# Patient Record
Sex: Female | Born: 1965 | Race: White | Hispanic: No | Marital: Married | Smoking: Never smoker
Health system: Southern US, Community
[De-identification: ages and names within clinical notes are randomized; demographics above are authoritative.]

## PROBLEM LIST (undated history)

## (undated) DIAGNOSIS — I82409 Acute embolism and thrombosis of unspecified deep veins of unspecified lower extremity: Secondary | ICD-10-CM

## (undated) DIAGNOSIS — M25559 Pain in unspecified hip: Secondary | ICD-10-CM

## (undated) DIAGNOSIS — R51 Headache: Secondary | ICD-10-CM

## (undated) DIAGNOSIS — I2699 Other pulmonary embolism without acute cor pulmonale: Secondary | ICD-10-CM

## (undated) DIAGNOSIS — M26629 Arthralgia of temporomandibular joint, unspecified side: Secondary | ICD-10-CM

## (undated) DIAGNOSIS — D649 Anemia, unspecified: Secondary | ICD-10-CM

## (undated) DIAGNOSIS — M1990 Unspecified osteoarthritis, unspecified site: Secondary | ICD-10-CM

## (undated) DIAGNOSIS — F329 Major depressive disorder, single episode, unspecified: Secondary | ICD-10-CM

## (undated) DIAGNOSIS — F32A Depression, unspecified: Secondary | ICD-10-CM

## (undated) DIAGNOSIS — K219 Gastro-esophageal reflux disease without esophagitis: Secondary | ICD-10-CM

## (undated) HISTORY — PX: HYSTEROSCOPY W/ ENDOMETRIAL ABLATION: SUR665

## (undated) HISTORY — PX: JOINT REPLACEMENT: SHX530

## (undated) HISTORY — PX: GASTRIC BYPASS: SHX52

## (undated) HISTORY — PX: NASAL SINUS SURGERY: SHX719

---

## 1987-03-13 HISTORY — PX: FEMUR FRACTURE SURGERY: SHX633

## 2004-02-06 ENCOUNTER — Other Ambulatory Visit: Payer: Self-pay

## 2004-02-06 ENCOUNTER — Emergency Department: Payer: Self-pay | Admitting: Emergency Medicine

## 2004-02-07 ENCOUNTER — Ambulatory Visit: Payer: Self-pay | Admitting: Emergency Medicine

## 2004-07-04 ENCOUNTER — Ambulatory Visit: Payer: Self-pay | Admitting: Neurology

## 2004-08-21 ENCOUNTER — Emergency Department: Payer: Self-pay | Admitting: Emergency Medicine

## 2004-12-11 ENCOUNTER — Ambulatory Visit: Payer: Self-pay | Admitting: Urology

## 2005-02-05 ENCOUNTER — Emergency Department: Payer: Self-pay | Admitting: Emergency Medicine

## 2006-07-30 ENCOUNTER — Ambulatory Visit: Payer: Self-pay

## 2006-11-27 ENCOUNTER — Ambulatory Visit: Payer: Self-pay | Admitting: Obstetrics and Gynecology

## 2006-11-29 ENCOUNTER — Ambulatory Visit: Payer: Self-pay | Admitting: Obstetrics and Gynecology

## 2008-03-31 ENCOUNTER — Ambulatory Visit: Payer: Self-pay | Admitting: Internal Medicine

## 2010-12-12 ENCOUNTER — Observation Stay: Payer: Self-pay | Admitting: Internal Medicine

## 2010-12-12 DIAGNOSIS — R072 Precordial pain: Secondary | ICD-10-CM

## 2010-12-14 ENCOUNTER — Inpatient Hospital Stay: Payer: Self-pay | Admitting: Psychiatry

## 2011-03-13 DIAGNOSIS — I82409 Acute embolism and thrombosis of unspecified deep veins of unspecified lower extremity: Secondary | ICD-10-CM

## 2011-03-13 DIAGNOSIS — I2699 Other pulmonary embolism without acute cor pulmonale: Secondary | ICD-10-CM

## 2011-03-13 HISTORY — DX: Other pulmonary embolism without acute cor pulmonale: I26.99

## 2011-03-13 HISTORY — DX: Acute embolism and thrombosis of unspecified deep veins of unspecified lower extremity: I82.409

## 2011-06-26 ENCOUNTER — Ambulatory Visit: Payer: Self-pay | Admitting: Unknown Physician Specialty

## 2011-09-05 ENCOUNTER — Ambulatory Visit: Payer: Self-pay | Admitting: Psychiatry

## 2011-09-10 ENCOUNTER — Ambulatory Visit: Payer: Self-pay | Admitting: Psychiatry

## 2011-10-23 ENCOUNTER — Ambulatory Visit: Payer: Self-pay | Admitting: Internal Medicine

## 2011-12-14 ENCOUNTER — Inpatient Hospital Stay: Payer: Self-pay | Admitting: Internal Medicine

## 2011-12-14 ENCOUNTER — Ambulatory Visit: Payer: Self-pay

## 2011-12-14 LAB — COMPREHENSIVE METABOLIC PANEL
Alkaline Phosphatase: 88 U/L (ref 50–136)
Anion Gap: 9 (ref 7–16)
BUN: 14 mg/dL (ref 7–18)
Bilirubin,Total: 0.4 mg/dL (ref 0.2–1.0)
Chloride: 103 mmol/L (ref 98–107)
Co2: 28 mmol/L (ref 21–32)
Creatinine: 1.13 mg/dL (ref 0.60–1.30)
EGFR (Non-African Amer.): 59 — ABNORMAL LOW
Osmolality: 279 (ref 275–301)
Potassium: 4.1 mmol/L (ref 3.5–5.1)
Sodium: 140 mmol/L (ref 136–145)
Total Protein: 7.6 g/dL (ref 6.4–8.2)

## 2011-12-14 LAB — CBC WITH DIFFERENTIAL/PLATELET
Basophil %: 1.3 %
Eosinophil #: 0.3 10*3/uL (ref 0.0–0.7)
Eosinophil %: 2.6 %
HGB: 13.6 g/dL (ref 12.0–16.0)
Lymphocyte %: 27.8 %
MCH: 31.5 pg (ref 26.0–34.0)
Monocyte #: 0.6 x10 3/mm (ref 0.2–0.9)
Neutrophil %: 63 %
RBC: 4.31 10*6/uL (ref 3.80–5.20)

## 2011-12-14 LAB — APTT: Activated PTT: 29.1 secs (ref 23.6–35.9)

## 2011-12-14 LAB — PROTIME-INR
INR: 1
Prothrombin Time: 13.2 secs (ref 11.5–14.7)

## 2011-12-15 LAB — CBC WITH DIFFERENTIAL/PLATELET
Basophil #: 0.1 10*3/uL (ref 0.0–0.1)
Eosinophil #: 0.3 10*3/uL (ref 0.0–0.7)
Lymphocyte #: 3.1 10*3/uL (ref 1.0–3.6)
MCV: 96 fL (ref 80–100)
Monocyte #: 0.6 x10 3/mm (ref 0.2–0.9)
Neutrophil %: 59.6 %
Platelet: 302 10*3/uL (ref 150–440)
RBC: 3.91 10*6/uL (ref 3.80–5.20)
WBC: 10.2 10*3/uL (ref 3.6–11.0)

## 2011-12-15 LAB — BASIC METABOLIC PANEL
Anion Gap: 11 (ref 7–16)
BUN: 17 mg/dL (ref 7–18)
Calcium, Total: 8.2 mg/dL — ABNORMAL LOW (ref 8.5–10.1)
Chloride: 105 mmol/L (ref 98–107)
Co2: 24 mmol/L (ref 21–32)
EGFR (Non-African Amer.): 54 — ABNORMAL LOW
Glucose: 152 mg/dL — ABNORMAL HIGH (ref 65–99)
Osmolality: 284 (ref 275–301)
Potassium: 3.7 mmol/L (ref 3.5–5.1)
Sodium: 140 mmol/L (ref 136–145)

## 2011-12-16 LAB — CBC WITH DIFFERENTIAL/PLATELET
Basophil #: 0 10*3/uL (ref 0.0–0.1)
Basophil %: 0.3 %
Eosinophil #: 0.3 10*3/uL (ref 0.0–0.7)
Eosinophil %: 2.5 %
HCT: 37.3 % (ref 35.0–47.0)
HGB: 12.8 g/dL (ref 12.0–16.0)
Lymphocyte %: 23.7 %
MCH: 32.7 pg (ref 26.0–34.0)
MCHC: 34.3 g/dL (ref 32.0–36.0)
Monocyte #: 0.7 x10 3/mm (ref 0.2–0.9)
Monocyte %: 6 %
Neutrophil #: 7.8 10*3/uL — ABNORMAL HIGH (ref 1.4–6.5)
Neutrophil %: 67.5 %
RDW: 13.9 % (ref 11.5–14.5)

## 2011-12-16 LAB — MAGNESIUM: Magnesium: 2.4 mg/dL

## 2011-12-16 LAB — BASIC METABOLIC PANEL
Anion Gap: 9 (ref 7–16)
Calcium, Total: 8 mg/dL — ABNORMAL LOW (ref 8.5–10.1)
Chloride: 107 mmol/L (ref 98–107)
Co2: 26 mmol/L (ref 21–32)
Creatinine: 1.13 mg/dL (ref 0.60–1.30)
Glucose: 99 mg/dL (ref 65–99)
Osmolality: 285 (ref 275–301)
Potassium: 4.7 mmol/L (ref 3.5–5.1)

## 2012-09-22 ENCOUNTER — Ambulatory Visit: Payer: Self-pay

## 2012-12-10 ENCOUNTER — Other Ambulatory Visit: Payer: Self-pay | Admitting: General Practice

## 2012-12-10 DIAGNOSIS — R0683 Snoring: Secondary | ICD-10-CM

## 2012-12-10 DIAGNOSIS — E669 Obesity, unspecified: Secondary | ICD-10-CM

## 2012-12-12 ENCOUNTER — Ambulatory Visit: Payer: Self-pay | Admitting: Bariatrics

## 2012-12-12 LAB — COMPREHENSIVE METABOLIC PANEL
Anion Gap: 6 — ABNORMAL LOW (ref 7–16)
BUN: 23 mg/dL — ABNORMAL HIGH (ref 7–18)
Glucose: 115 mg/dL — ABNORMAL HIGH (ref 65–99)
Osmolality: 275 (ref 275–301)
SGOT(AST): 20 U/L (ref 15–37)
SGPT (ALT): 22 U/L (ref 12–78)
Sodium: 135 mmol/L — ABNORMAL LOW (ref 136–145)

## 2012-12-12 LAB — PROTIME-INR
INR: 1
Prothrombin Time: 13.2 secs (ref 11.5–14.7)

## 2012-12-12 LAB — CBC WITH DIFFERENTIAL/PLATELET
Basophil #: 0.1 10*3/uL (ref 0.0–0.1)
Basophil %: 0.7 %
Eosinophil #: 0.2 10*3/uL (ref 0.0–0.7)
HGB: 13.5 g/dL (ref 12.0–16.0)
MCHC: 34.6 g/dL (ref 32.0–36.0)
MCV: 93 fL (ref 80–100)
Monocyte %: 5.7 %
Neutrophil %: 62 %
RDW: 14.8 % — ABNORMAL HIGH (ref 11.5–14.5)

## 2012-12-12 LAB — AMYLASE: Amylase: 50 U/L (ref 25–115)

## 2012-12-12 LAB — LIPASE, BLOOD: Lipase: 143 U/L (ref 73–393)

## 2012-12-12 LAB — HEMOGLOBIN A1C: Hemoglobin A1C: 6.9 % — ABNORMAL HIGH (ref 4.2–6.3)

## 2012-12-19 LAB — IRON AND TIBC
Iron Bind.Cap.(Total): 357 ug/dL (ref 250–450)
Iron Saturation: 26 %

## 2012-12-31 ENCOUNTER — Ambulatory Visit
Admission: RE | Admit: 2012-12-31 | Discharge: 2012-12-31 | Disposition: A | Discharge status: Home / Self Care | Payer: BC Managed Care – PPO | Source: Ambulatory Visit | Attending: General Practice | Admitting: General Practice

## 2012-12-31 DIAGNOSIS — E669 Obesity, unspecified: Secondary | ICD-10-CM

## 2012-12-31 DIAGNOSIS — R0683 Snoring: Secondary | ICD-10-CM

## 2013-01-09 ENCOUNTER — Ambulatory Visit: Payer: Self-pay | Admitting: General Practice

## 2013-01-10 ENCOUNTER — Ambulatory Visit: Payer: Self-pay | Admitting: General Practice

## 2013-01-15 ENCOUNTER — Ambulatory Visit: Payer: Self-pay | Admitting: Oncology

## 2013-01-22 ENCOUNTER — Ambulatory Visit: Payer: Self-pay | Admitting: Oncology

## 2013-01-29 ENCOUNTER — Other Ambulatory Visit: Payer: Self-pay | Admitting: Bariatrics

## 2013-02-09 ENCOUNTER — Ambulatory Visit: Payer: Self-pay | Admitting: Oncology

## 2013-02-09 ENCOUNTER — Other Ambulatory Visit: Payer: Self-pay | Admitting: Bariatrics

## 2013-03-02 ENCOUNTER — Ambulatory Visit: Payer: Self-pay | Admitting: General Practice

## 2013-03-12 ENCOUNTER — Ambulatory Visit: Payer: Self-pay | Admitting: General Practice

## 2013-03-12 HISTORY — PX: TOTAL HIP ARTHROPLASTY: SHX124

## 2013-07-17 ENCOUNTER — Ambulatory Visit: Payer: Self-pay | Admitting: General Practice

## 2013-07-20 ENCOUNTER — Other Ambulatory Visit: Payer: Self-pay | Admitting: Obstetrics and Gynecology

## 2013-07-23 ENCOUNTER — Encounter (HOSPITAL_COMMUNITY): Payer: Self-pay | Admitting: Pharmacist

## 2013-07-27 DIAGNOSIS — M8700 Idiopathic aseptic necrosis of unspecified bone: Secondary | ICD-10-CM | POA: Insufficient documentation

## 2013-07-28 ENCOUNTER — Encounter (HOSPITAL_COMMUNITY): Payer: Self-pay | Admitting: *Deleted

## 2013-07-29 NOTE — H&P (Signed)
NAMEMILIANI, DEIKE           ACCOUNT NO.:  [id number]  MEDICAL RECORD NO.:  [id number]  LOCATION:  [facility]                         FACILITY:  [facility]  PHYSICIAN:  Lovenia Kim, M.D.DATE OF BIRTH:  03/03/1966  DATE OF ADMISSION:  07/20/2013 DATE OF DISCHARGE:                             HISTORY & PHYSICAL   __________  CHIEF COMPLAINT:  Menometrorrhagia.  HISTORY OF PRESENT ILLNESS:  A 48 year old white female G0, P0, who presents for diagnostic hysteroscopy and endometrial ablation.  PAST MEDICAL HISTORY:  She has past medical history remarkable for hypertension, migraine headaches, pulmonary embolism, currently on no anticoagulation.  FAMILY HISTORY:  Pulmonary fibrosis, breast cancer, hypertension, ovarian cancer.  ALLERGIES:  She has no known drug allergies.  MEDICATIONS:  Torsemide, multivitamin, Prozac, vitamin supplements transdermal patch, and lorazepam as needed  PAST SURGICAL HISTORY:  Gastric bypass, sinus surgery, and right leg surgery.  PHYSICAL EXAMINATION:  GENERAL:  She is a well-developed, well-nourished white female, in no acute distress. HEENT:  Normal. NECK:  Supple.  Full range of motion. LUNGS:  Clear. HEART:  Regular rate and rhythm. ABDOMEN:  Soft, nontender. PELVIC:  Anteverted uterus, NSSC, no adnexal masses. EXTREMITIES:  There were no cords. NEUROLOGIC:  Nonfocal. SKIN:  Intact.  IMPRESSION:  Refractory menometrorrhagia  for definitive therapy.  PLAN:  Proceed with diagnostic hysteroscopy, D and C, NovaSure endometrial ablation.  Risks of anesthesia, infection, bleeding, injury to surrounding organs, possible need for repair were discussed.  Delayed versus immediate complications include bowel and bladder injury noted  NovaSure and possible failed  treatment are discussed. The patient acknowledges and wishes to proceed.     Lovenia Kim, M.D.     RJT/MEDQ  D:  07/29/2013  T:  07/29/2013  Job:  [id number]

## 2013-07-30 ENCOUNTER — Encounter (HOSPITAL_COMMUNITY): Payer: BC Managed Care – PPO | Admitting: Anesthesiology

## 2013-07-30 ENCOUNTER — Encounter (HOSPITAL_COMMUNITY): Payer: Self-pay | Admitting: *Deleted

## 2013-07-30 ENCOUNTER — Ambulatory Visit (HOSPITAL_COMMUNITY)
Admission: RE | Admit: 2013-07-30 | Discharge: 2013-07-30 | Disposition: A | Discharge status: Home / Self Care | Payer: BC Managed Care – PPO | Source: Ambulatory Visit | Attending: Obstetrics and Gynecology | Admitting: Obstetrics and Gynecology

## 2013-07-30 ENCOUNTER — Ambulatory Visit (HOSPITAL_COMMUNITY): Payer: BC Managed Care – PPO | Admitting: Anesthesiology

## 2013-07-30 ENCOUNTER — Encounter (HOSPITAL_COMMUNITY)
Admission: RE | Disposition: A | Discharge status: Home / Self Care | Payer: Self-pay | Source: Ambulatory Visit | Attending: Obstetrics and Gynecology

## 2013-07-30 DIAGNOSIS — F329 Major depressive disorder, single episode, unspecified: Secondary | ICD-10-CM | POA: Insufficient documentation

## 2013-07-30 DIAGNOSIS — N920 Excessive and frequent menstruation with regular cycle: Secondary | ICD-10-CM | POA: Insufficient documentation

## 2013-07-30 DIAGNOSIS — I10 Essential (primary) hypertension: Secondary | ICD-10-CM | POA: Insufficient documentation

## 2013-07-30 DIAGNOSIS — F3289 Other specified depressive episodes: Secondary | ICD-10-CM | POA: Insufficient documentation

## 2013-07-30 DIAGNOSIS — N840 Polyp of corpus uteri: Secondary | ICD-10-CM | POA: Insufficient documentation

## 2013-07-30 DIAGNOSIS — Z86711 Personal history of pulmonary embolism: Secondary | ICD-10-CM | POA: Insufficient documentation

## 2013-07-30 DIAGNOSIS — Z9884 Bariatric surgery status: Secondary | ICD-10-CM | POA: Insufficient documentation

## 2013-07-30 HISTORY — DX: Depression, unspecified: F32.A

## 2013-07-30 HISTORY — DX: Major depressive disorder, single episode, unspecified: F32.9

## 2013-07-30 HISTORY — DX: Other pulmonary embolism without acute cor pulmonale: I26.99

## 2013-07-30 HISTORY — DX: Arthralgia of temporomandibular joint, unspecified side: M26.629

## 2013-07-30 HISTORY — PX: DILITATION & CURRETTAGE/HYSTROSCOPY WITH NOVASURE ABLATION: SHX5568

## 2013-07-30 HISTORY — DX: Headache: R51

## 2013-07-30 HISTORY — DX: Pain in unspecified hip: M25.559

## 2013-07-30 LAB — BASIC METABOLIC PANEL
BUN: 15 mg/dL (ref 6–23)
CHLORIDE: 100 meq/L (ref 96–112)
CO2: 29 mEq/L (ref 19–32)
Calcium: 8.9 mg/dL (ref 8.4–10.5)
Creatinine, Ser: 1.08 mg/dL (ref 0.50–1.10)
GFR calc Af Amer: 71 mL/min — ABNORMAL LOW (ref >90)
GFR calc non Af Amer: 61 mL/min — ABNORMAL LOW (ref >90)
GLUCOSE: 96 mg/dL (ref 70–99)
POTASSIUM: 3.6 meq/L — AB (ref 3.7–5.3)
SODIUM: 140 meq/L (ref 137–147)

## 2013-07-30 LAB — CBC
HEMATOCRIT: 37.2 % (ref 36.0–46.0)
HEMOGLOBIN: 12.2 g/dL (ref 12.0–15.0)
MCH: 30.3 pg (ref 26.0–34.0)
MCHC: 32.8 g/dL (ref 30.0–36.0)
MCV: 92.5 fL (ref 78.0–100.0)
Platelets: 422 10*3/uL — ABNORMAL HIGH (ref 150–400)
RBC: 4.02 MIL/uL (ref 3.87–5.11)
RDW: 14.7 % (ref 11.5–15.5)
WBC: 9 10*3/uL (ref 4.0–10.5)

## 2013-07-30 LAB — HCG, SERUM, QUALITATIVE: Preg, Serum: NEGATIVE

## 2013-07-30 SURGERY — DILATATION & CURETTAGE/HYSTEROSCOPY WITH NOVASURE ABLATION
Anesthesia: General | Site: Uterus

## 2013-07-30 MED ORDER — CEFAZOLIN SODIUM-DEXTROSE 2-3 GM-% IV SOLR
2.0000 g | INTRAVENOUS | Status: AC
  Administered 2013-07-30: 2 g via INTRAVENOUS

## 2013-07-30 MED ORDER — FENTANYL CITRATE 0.05 MG/ML IJ SOLN
INTRAMUSCULAR | Status: DC | PRN
  Administered 2013-07-30 (×3): 50 ug via INTRAVENOUS

## 2013-07-30 MED ORDER — BUPIVACAINE HCL (PF) 0.25 % IJ SOLN
INTRAMUSCULAR | Status: AC
  Filled 2013-07-30: qty 30

## 2013-07-30 MED ORDER — ONDANSETRON HCL 4 MG/2ML IJ SOLN
INTRAMUSCULAR | Status: DC | PRN
  Administered 2013-07-30: 4 mg via INTRAVENOUS

## 2013-07-30 MED ORDER — LIDOCAINE HCL (CARDIAC) 20 MG/ML IV SOLN
INTRAVENOUS | Status: DC | PRN
  Administered 2013-07-30: 80 mg via INTRAVENOUS

## 2013-07-30 MED ORDER — MEPERIDINE HCL 25 MG/ML IJ SOLN: 6.2500 mg | INTRAMUSCULAR | Status: DC | PRN

## 2013-07-30 MED ORDER — KETOROLAC TROMETHAMINE 30 MG/ML IJ SOLN
INTRAMUSCULAR | Status: DC | PRN
  Administered 2013-07-30: 30 mg via INTRAVENOUS

## 2013-07-30 MED ORDER — BUPIVACAINE HCL (PF) 0.25 % IJ SOLN
INTRAMUSCULAR | Status: DC | PRN
  Administered 2013-07-30: 20 mL

## 2013-07-30 MED ORDER — PROPOFOL INFUSION 10 MG/ML OPTIME
INTRAVENOUS | Status: DC | PRN
  Administered 2013-07-30: 180 mL via INTRAVENOUS

## 2013-07-30 MED ORDER — KETOROLAC TROMETHAMINE 30 MG/ML IJ SOLN: 15.0000 mg | Freq: Once | INTRAMUSCULAR | Status: DC | PRN

## 2013-07-30 MED ORDER — LACTATED RINGERS IV SOLN
INTRAVENOUS | Status: DC
  Administered 2013-07-30 (×3): via INTRAVENOUS

## 2013-07-30 MED ORDER — HYDROCODONE-ACETAMINOPHEN 5-325 MG PO TABS: 1.0000 | ORAL_TABLET | Freq: Four times a day (QID) | ORAL | Status: DC | PRN

## 2013-07-30 MED ORDER — TRAMADOL HCL 50 MG PO TABS: 50.0000 mg | ORAL_TABLET | Freq: Four times a day (QID) | ORAL | Status: DC | PRN

## 2013-07-30 MED ORDER — FENTANYL CITRATE 0.05 MG/ML IJ SOLN
INTRAMUSCULAR | Status: AC
  Filled 2013-07-30: qty 5

## 2013-07-30 MED ORDER — LIDOCAINE HCL (CARDIAC) 20 MG/ML IV SOLN
INTRAVENOUS | Status: AC
  Filled 2013-07-30: qty 5

## 2013-07-30 MED ORDER — FENTANYL CITRATE 0.05 MG/ML IJ SOLN
25.0000 ug | INTRAMUSCULAR | Status: DC | PRN
  Administered 2013-07-30 (×2): 50 ug via INTRAVENOUS

## 2013-07-30 MED ORDER — MIDAZOLAM HCL 2 MG/2ML IJ SOLN
INTRAMUSCULAR | Status: AC
  Filled 2013-07-30: qty 2

## 2013-07-30 MED ORDER — BUPIVACAINE-EPINEPHRINE (PF) 0.25% -1:200000 IJ SOLN
INTRAMUSCULAR | Status: AC
  Filled 2013-07-30: qty 30

## 2013-07-30 MED ORDER — DEXAMETHASONE SODIUM PHOSPHATE 10 MG/ML IJ SOLN
INTRAMUSCULAR | Status: AC
  Filled 2013-07-30: qty 1

## 2013-07-30 MED ORDER — VASOPRESSIN 20 UNIT/ML IJ SOLN
INTRAMUSCULAR | Status: AC
  Filled 2013-07-30: qty 1

## 2013-07-30 MED ORDER — ONDANSETRON HCL 4 MG/2ML IJ SOLN: 4.0000 mg | Freq: Once | INTRAMUSCULAR | Status: DC | PRN

## 2013-07-30 MED ORDER — DEXAMETHASONE SODIUM PHOSPHATE 4 MG/ML IJ SOLN
INTRAMUSCULAR | Status: DC | PRN
  Administered 2013-07-30: 10 mg via INTRAVENOUS

## 2013-07-30 MED ORDER — SODIUM CHLORIDE 0.9 % IJ SOLN
INTRAMUSCULAR | Status: AC
  Filled 2013-07-30: qty 50

## 2013-07-30 MED ORDER — PROPOFOL 10 MG/ML IV EMUL
INTRAVENOUS | Status: AC
  Filled 2013-07-30: qty 20

## 2013-07-30 MED ORDER — MIDAZOLAM HCL 2 MG/2ML IJ SOLN
INTRAMUSCULAR | Status: DC | PRN
  Administered 2013-07-30: 2 mg via INTRAVENOUS

## 2013-07-30 MED ORDER — LACTATED RINGERS IR SOLN
Status: DC | PRN
  Administered 2013-07-30: 3000 mL

## 2013-07-30 MED ORDER — ONDANSETRON HCL 4 MG/2ML IJ SOLN
INTRAMUSCULAR | Status: AC
Start: 2013-07-30 — End: 2013-07-30
  Filled 2013-07-30: qty 2

## 2013-07-30 MED ORDER — FENTANYL CITRATE 0.05 MG/ML IJ SOLN
INTRAMUSCULAR | Status: AC
  Administered 2013-07-30: 50 ug via INTRAVENOUS
  Filled 2013-07-30: qty 2

## 2013-07-30 SURGICAL SUPPLY — 19 items
ABLATOR ENDOMETRIAL BIPOLAR (ABLATOR) ×2 IMPLANT
CATH ROBINSON RED A/P 16FR (CATHETERS) ×2 IMPLANT
CATH THERMACHOICE III (CATHETERS) ×2 IMPLANT
CLOTH BEACON ORANGE TIMEOUT ST (SAFETY) ×2 IMPLANT
CONTAINER PREFILL 10% NBF 60ML (FORM) ×4 IMPLANT
DRAPE HYSTEROSCOPY (DRAPE) ×2 IMPLANT
DRSG TELFA 3X8 NADH (GAUZE/BANDAGES/DRESSINGS) ×2 IMPLANT
GLOVE BIO SURGEON STRL SZ7.5 (GLOVE) ×2 IMPLANT
GOWN STRL REUS W/TWL LRG LVL3 (GOWN DISPOSABLE) ×4 IMPLANT
NEEDLE SPNL 22GX3.5 QUINCKE BK (NEEDLE) ×2 IMPLANT
PACK VAGINAL MINOR WOMEN LF (CUSTOM PROCEDURE TRAY) ×2 IMPLANT
PAD OB MATERNITY 4.3X12.25 (PERSONAL CARE ITEMS) ×2 IMPLANT
PAD PREP 24X48 CUFFED NSTRL (MISCELLANEOUS) ×2 IMPLANT
SET TUBING HYSTEROSCOPY 2 NDL (TUBING) IMPLANT
SYR CONTROL 10ML LL (SYRINGE) ×2 IMPLANT
SYR TB 1ML 25GX5/8 (SYRINGE) ×2 IMPLANT
TOWEL OR 17X24 6PK STRL BLUE (TOWEL DISPOSABLE) ×4 IMPLANT
TUBE HYSTEROSCOPY W Y-CONNECT (TUBING) IMPLANT
WATER STERILE IRR 1000ML POUR (IV SOLUTION) ×2 IMPLANT

## 2013-07-30 NOTE — Anesthesia Postprocedure Evaluation (Signed)
  Anesthesia Post-op Note  Anesthesia Post Note  Patient: Brittney Diaz  Procedure(s) Performed: Procedure(s) (LRB): DILATATION & CURETTAGE/HYSTEROSCOPY WITH  THERMACHOICE, AND POLYPECTOMY (N/A)  Anesthesia type: General  Patient location: PACU  Post pain: Pain level controlled  Post assessment: Post-op Vital signs reviewed  Last Vitals:  Filed Vitals:   07/30/13 1100  BP: 116/68  Pulse: 51  Temp: 36.6 C  Resp: 16    Post vital signs: Reviewed  Level of consciousness: sedated  Complications: No apparent anesthesia complications

## 2013-07-30 NOTE — Anesthesia Preprocedure Evaluation (Signed)
Anesthesia Evaluation  Patient identified by MRN, date of birth, ID band Patient awake    Reviewed: Allergy & Precautions, H&P , NPO status , Patient's Chart, lab work & pertinent test results, reviewed documented beta blocker date and time   History of Anesthesia Complications Negative for: history of anesthetic complications  Airway Mallampati: I TM Distance: >3 FB Neck ROM: full    Dental  (+) Teeth Intact   Pulmonary PE (2013 - was on xarelto, stopped after 6 months) breath sounds clear to auscultation  Pulmonary exam normal       Cardiovascular negative cardio ROS  - dysrhythmias Rhythm:regular Rate:Normal     Neuro/Psych  Headaches (migraine - once every few weeks), Depression    GI/Hepatic negative GI ROS, Neg liver ROS,   Endo/Other  Obese - BMI 33.4  Renal/GU negative Renal ROS  Female GU complaint     Musculoskeletal   Abdominal   Peds  Hematology negative hematology ROS (+)   Anesthesia Other Findings Left hip pain, possibly due to AVN - CAREFUL POSITIONING  Reproductive/Obstetrics negative OB ROS                           Anesthesia Physical Anesthesia Plan  ASA: II  Anesthesia Plan: General LMA   Post-op Pain Management:    Induction:   Airway Management Planned:   Additional Equipment:   Intra-op Plan:   Post-operative Plan:   Informed Consent: I have reviewed the patients History and Physical, chart, labs and discussed the procedure including the risks, benefits and alternatives for the proposed anesthesia with the patient or authorized representative who has indicated his/her understanding and acceptance.   Dental Advisory Given  Plan Discussed with: CRNA and Surgeon  Anesthesia Plan Comments:         Anesthesia Quick Evaluation

## 2013-07-30 NOTE — Discharge Instructions (Signed)
DISCHARGE INSTRUCTIONS: D&C / D&E The following instructions have been prepared to help you care for yourself upon your return home.  MAY TAKE IBUPROFEN (MOTRIN, ADVIL) OR ALEVE AFTER 4:00 PM FOR CRAMPS!!!   Personal hygiene:  Use sanitary pads for vaginal drainage, not tampons.  Shower the day after your procedure.  NO tub baths, pools or Jacuzzis for 2-3 weeks.  Wipe front to back after using the bathroom.  Activity and limitations:  Do NOT drive or operate any equipment for 24 hours. The effects of anesthesia are still present and drowsiness may result.  Do NOT rest in bed all day.  Walking is encouraged.  Walk up and down stairs slowly.  You may resume your normal activity in one to two days or as indicated by your physician.  Sexual activity: NO intercourse for at least 2 weeks after the procedure, or as indicated by your physician.  Diet: Eat a light meal as desired this evening. You may resume your usual diet tomorrow.  Return to work: You may resume your work activities in one to two days or as indicated by your doctor.  What to expect after your surgery: Expect to have vaginal bleeding/discharge for 2-3 days and spotting for up to 10 days. It is not unusual to have soreness for up to 1-2 weeks. You may have a slight burning sensation when you urinate for the first day. Mild cramps may continue for a couple of days. You may have a regular period in 2-6 weeks.  Call your doctor for any of the following:  Excessive vaginal bleeding, saturating and changing one pad every hour.  Inability to urinate 6 hours after discharge from hospital.  Pain not relieved by pain medication.  Fever of 100.4 F or greater.  Unusual vaginal discharge or odor.   Call for an appointment:    Patients signature: ______________________  Nurses signature ________________________  Support person's signature_______________________

## 2013-07-30 NOTE — Progress Notes (Signed)
Patient ID: Brittney Diaz, female   DOB: 07-Jul-1965, 48 y.o.   MRN: [id number] Patient seen and examined. Consent witnessed and signed. No changes noted. Update completed. CBC    Component Value Date/Time   WBC 9.0 07/30/2013 0725   RBC 4.02 07/30/2013 0725   HGB 12.2 07/30/2013 0725   HCT 37.2 07/30/2013 0725   PLT 422* 07/30/2013 0725   MCV 92.5 07/30/2013 0725   MCH 30.3 07/30/2013 0725   MCHC 32.8 07/30/2013 0725   RDW 14.7 07/30/2013 0725

## 2013-07-30 NOTE — Op Note (Signed)
Brittney Diaz, Brittney Diaz           ACCOUNT NO.:  [id number]  MEDICAL RECORD NO.:  [id number]  LOCATION:  [facility]                          FACILITY:  [facility]  PHYSICIAN:  Lovenia Kim, M.D.DATE OF BIRTH:  12-Dec-1965  DATE OF PROCEDURE: DATE OF DISCHARGE:  07/30/2013                              OPERATIVE REPORT   PREOPERATIVE DIAGNOSIS:  Menometrorrhagia.  POSTOPERATIVE DIAGNOSES:  Menometrorrhagia plus endometrial polyp.  PROCEDURES:  Diagnostic hysteroscopy, D and C, removal of endometrial polyp, ThermaChoice endometrial ablation, attempted NovaSure endometrial ablation.  SURGEON:  Lovenia Kim, M.D.  ASSISTANT:  None.  ANESTHESIA:  General and local.  ESTIMATED BLOOD LOSS:  Less than 50 mL.  FLUID DEFICIT:  150 mL.  DRAINS:  None.  COUNTS:  Correct.  DISPOSITION:  The patient to recovery in good condition.  BRIEF OPERATIVE NOTE:  After being apprised of risks of anesthesia, infection, bleeding, injury to surrounding organs, possible need for repair, delayed versus immediate complications to include bowel and bladder injury, possible need for repair, the patient was brought to the operating room where she was administered a general anesthetic without complications, prepped in the usual sterile fashion, catheterized until the bladder was empty.  Exam under anesthesia revealed a small, but bulky anteflexed uterus and some apical vaginal stenosis.  At this time, the Sims retractor was placed without difficulty.  Cervix was grasped using a single-tooth tenaculum and the dilute Marcaine solution was placed 20 mL total with paracervical block.  Cervix was then easily dilated up to a #23 Pratt dilator.  Hysteroscope placed.  Visualization revealed a focal endometrial polyp at the left fundal area.  An operative forceps was then placed through the operative channel of the diagnostic hysteroscope to remove this polyp without difficulty.  D and C was then performed in a  4-quadrant method.  NovaSure device was then placed.  It was unable to be seated due to the thin endometrial cavity. Therefore, the ThermaChoice device was placed and primed in the usual fashion and insufflated for a procedure lasting 8 minutes with normal cool down.  Device was removed and found to be intact.  Visualization of the cavity revealed evidence of good endometrial ablation.  All instruments were then removed.  The patient tolerated the procedure well.  Please note that on the repeat hysteroscopy, no evidence of perforation was noted.  The patient was awakened and transferred to recovery in good condition.     Lovenia Kim, M.D.     RJT/MEDQ  D:  07/30/2013  T:  07/30/2013  Job:  [id number]

## 2013-07-30 NOTE — Op Note (Signed)
07/30/2013  9:57 AM  PATIENT:  Brittney Diaz  48 y.o. female  PRE-OPERATIVE DIAGNOSIS:  Menorrhagia  POST-OPERATIVE DIAGNOSIS:  Same  PROCEDURE:  Procedure(s): DILATATION & CURETTAGE/HYSTEROSCOPY WITH  ABLATION- THERMACHOICE ENDOMETRIAL  POLYPECTOMY  SURGEON:  Surgeon(s): Lovenia Kim, MD  ASSISTANTS: none   ANESTHESIA:   local and general  ESTIMATED BLOOD LOSS: minimal  DRAINS: none   LOCAL MEDICATIONS USED:  MARCAINE    and Amount: 20 ml  SPECIMEN:  Source of Specimen:  EMC and polyp  DISPOSITION OF SPECIMEN:  PATHOLOGY  COUNTS:  YES  DICTATION #: [id number]  PLAN OF CARE: dc home  PATIENT DISPOSITION:  PACU - hemodynamically stable.

## 2013-07-30 NOTE — Transfer of Care (Signed)
Immediate Anesthesia Transfer of Care Note  Patient: Brittney Diaz  Procedure(s) Performed: Procedure(s): DILATATION & CURETTAGE/HYSTEROSCOPY WITH  THERMACHOICE, AND POLYPECTOMY (N/A)  Patient Location: PACU  Anesthesia Type:General  Level of Consciousness: awake, alert  and oriented  Airway & Oxygen Therapy: Patient Spontanous Breathing and Patient connected to nasal cannula oxygen  Post-op Assessment: Report given to PACU RN, Post -op Vital signs reviewed and stable and Patient moving all extremities  Post vital signs: Reviewed and stable  Complications: No apparent anesthesia complications

## 2013-07-31 ENCOUNTER — Encounter (HOSPITAL_COMMUNITY): Payer: Self-pay | Admitting: Obstetrics and Gynecology

## 2013-09-14 ENCOUNTER — Ambulatory Visit: Payer: Self-pay | Admitting: Gastroenterology

## 2013-11-10 DIAGNOSIS — G2581 Restless legs syndrome: Secondary | ICD-10-CM | POA: Insufficient documentation

## 2013-11-10 DIAGNOSIS — Z86711 Personal history of pulmonary embolism: Secondary | ICD-10-CM | POA: Insufficient documentation

## 2013-11-10 DIAGNOSIS — K219 Gastro-esophageal reflux disease without esophagitis: Secondary | ICD-10-CM | POA: Insufficient documentation

## 2013-11-17 ENCOUNTER — Ambulatory Visit: Payer: Self-pay | Admitting: Family Medicine

## 2013-11-20 ENCOUNTER — Ambulatory Visit: Payer: Self-pay | Admitting: Oncology

## 2013-11-27 ENCOUNTER — Ambulatory Visit: Payer: Self-pay | Admitting: Gastroenterology

## 2013-12-10 ENCOUNTER — Ambulatory Visit: Payer: Self-pay | Admitting: Oncology

## 2014-01-12 DIAGNOSIS — M25559 Pain in unspecified hip: Secondary | ICD-10-CM | POA: Insufficient documentation

## 2014-02-12 ENCOUNTER — Ambulatory Visit: Payer: Self-pay | Admitting: Bariatrics

## 2014-02-12 LAB — CBC WITH DIFFERENTIAL/PLATELET
BASOS ABS: 0.1 10*3/uL (ref 0.0–0.1)
Basophil %: 0.6 %
EOS ABS: 0.3 10*3/uL (ref 0.0–0.7)
Eosinophil %: 2.9 %
HCT: 37.3 % (ref 35.0–47.0)
HGB: 12 g/dL (ref 12.0–16.0)
LYMPHS ABS: 3.2 10*3/uL (ref 1.0–3.6)
Lymphocyte %: 30.6 %
MCH: 28.9 pg (ref 26.0–34.0)
MCHC: 32 g/dL (ref 32.0–36.0)
MCV: 90 fL (ref 80–100)
MONOS PCT: 4.8 %
Monocyte #: 0.5 x10 3/mm (ref 0.2–0.9)
NEUTROS PCT: 61.1 %
Neutrophil #: 6.4 10*3/uL (ref 1.4–6.5)
Platelet: 492 10*3/uL — ABNORMAL HIGH (ref 150–440)
RBC: 4.15 10*6/uL (ref 3.80–5.20)
RDW: 14.4 % (ref 11.5–14.5)
WBC: 10.5 10*3/uL (ref 3.6–11.0)

## 2014-02-12 LAB — COMPREHENSIVE METABOLIC PANEL
ALT: 22 U/L
Albumin: 3.5 g/dL (ref 3.4–5.0)
Alkaline Phosphatase: 126 U/L — ABNORMAL HIGH
Anion Gap: 8 (ref 7–16)
BUN: 16 mg/dL (ref 7–18)
Bilirubin,Total: 0.2 mg/dL (ref 0.2–1.0)
CREATININE: 1.15 mg/dL (ref 0.60–1.30)
Calcium, Total: 8.2 mg/dL — ABNORMAL LOW (ref 8.5–10.1)
Chloride: 98 mmol/L (ref 98–107)
Co2: 31 mmol/L (ref 21–32)
EGFR (African American): >60
GFR CALC NON AF AMER: 54 — AB
GLUCOSE: 121 mg/dL — AB (ref 65–99)
Osmolality: 276 (ref 275–301)
Potassium: 3 mmol/L — ABNORMAL LOW (ref 3.5–5.1)
SGOT(AST): 26 U/L (ref 15–37)
Sodium: 137 mmol/L (ref 136–145)
Total Protein: 7.7 g/dL (ref 6.4–8.2)

## 2014-02-12 LAB — FOLATE: FOLIC ACID: 11.6 ng/mL (ref 3.1–17.5)

## 2014-02-12 LAB — IRON: Iron: 34 ug/dL — ABNORMAL LOW (ref 50–170)

## 2014-02-12 LAB — HCG, QUANTITATIVE, PREGNANCY: Beta Hcg, Quant.: <1 m[IU]/mL — ABNORMAL LOW

## 2014-02-12 LAB — MAGNESIUM: Magnesium: 2 mg/dL

## 2014-02-12 LAB — PHOSPHORUS: Phosphorus: 3.1 mg/dL (ref 2.5–4.9)

## 2014-02-12 LAB — FERRITIN: Ferritin (ARMC): 16 ng/mL (ref 8–388)

## 2014-02-12 LAB — AMYLASE: AMYLASE: 68 U/L (ref 25–115)

## 2014-02-16 ENCOUNTER — Ambulatory Visit: Payer: Self-pay | Admitting: Bariatrics

## 2014-02-26 ENCOUNTER — Ambulatory Visit: Payer: Self-pay | Admitting: Urgent Care

## 2014-02-26 LAB — CBC WITH DIFFERENTIAL/PLATELET
BASOS PCT: 0.7 %
Basophil #: 0.1 10*3/uL (ref 0.0–0.1)
EOS ABS: 0.3 10*3/uL (ref 0.0–0.7)
Eosinophil %: 3.4 %
HCT: 35.3 % (ref 35.0–47.0)
HGB: 11.1 g/dL — ABNORMAL LOW (ref 12.0–16.0)
Lymphocyte #: 3 10*3/uL (ref 1.0–3.6)
Lymphocyte %: 38.5 %
MCH: 28.2 pg (ref 26.0–34.0)
MCHC: 31.5 g/dL — ABNORMAL LOW (ref 32.0–36.0)
MCV: 90 fL (ref 80–100)
MONO ABS: 0.5 x10 3/mm (ref 0.2–0.9)
Monocyte %: 6.4 %
NEUTROS ABS: 3.9 10*3/uL (ref 1.4–6.5)
Neutrophil %: 51 %
PLATELETS: 443 10*3/uL — AB (ref 150–440)
RBC: 3.94 10*6/uL (ref 3.80–5.20)
RDW: 14.8 % — ABNORMAL HIGH (ref 11.5–14.5)
WBC: 7.7 10*3/uL (ref 3.6–11.0)

## 2014-02-26 LAB — COMPREHENSIVE METABOLIC PANEL
ALBUMIN: 3.1 g/dL — AB (ref 3.4–5.0)
ALK PHOS: 117 U/L — AB
ALT: 16 U/L
Anion Gap: 5 — ABNORMAL LOW (ref 7–16)
BILIRUBIN TOTAL: 0.3 mg/dL (ref 0.2–1.0)
BUN: 13 mg/dL (ref 7–18)
CO2: 31 mmol/L (ref 21–32)
Calcium, Total: 8.5 mg/dL (ref 8.5–10.1)
Chloride: 105 mmol/L (ref 98–107)
Creatinine: 0.99 mg/dL (ref 0.60–1.30)
EGFR (African American): >60
EGFR (Non-African Amer.): >60
Glucose: 82 mg/dL (ref 65–99)
Osmolality: 280 (ref 275–301)
POTASSIUM: 4.2 mmol/L (ref 3.5–5.1)
SGOT(AST): 21 U/L (ref 15–37)
SODIUM: 141 mmol/L (ref 136–145)
TOTAL PROTEIN: 6.9 g/dL (ref 6.4–8.2)

## 2014-02-26 LAB — LIPASE, BLOOD: Lipase: 85 U/L (ref 73–393)

## 2014-03-23 DIAGNOSIS — Z96649 Presence of unspecified artificial hip joint: Secondary | ICD-10-CM | POA: Insufficient documentation

## 2014-03-30 ENCOUNTER — Ambulatory Visit: Payer: Self-pay

## 2014-03-30 DIAGNOSIS — Z9889 Other specified postprocedural states: Secondary | ICD-10-CM | POA: Insufficient documentation

## 2014-03-30 DIAGNOSIS — M7989 Other specified soft tissue disorders: Secondary | ICD-10-CM | POA: Insufficient documentation

## 2014-04-20 DIAGNOSIS — R269 Unspecified abnormalities of gait and mobility: Secondary | ICD-10-CM | POA: Insufficient documentation

## 2014-04-20 DIAGNOSIS — M2170 Unequal limb length (acquired), unspecified site: Secondary | ICD-10-CM | POA: Insufficient documentation

## 2014-06-29 NOTE — H&P (Signed)
PATIENT NAME:  Brittney Diaz, Brittney Diaz MR#:  [id number] DATE OF BIRTH:  04/29/1965  DATE OF ADMISSION:  12/14/2011  PRIMARY CARE PHYSICIAN: Tama High, III, MD   CHIEF COMPLAINT: Left lower extremity redness and swelling.   HISTORY OF PRESENT ILLNESS: The patient is a 49 year old female who presents to the Emergency Room as she was noted to have acute pulmonary embolism on a CT of the chest done today. The patient has been having symptoms of lower extremity swelling, paresthesias and pain for about two weeks or so. She was seen by Podiatry and then referred to Neurology, had an EMG study, was getting worked up for polyneuropathy. She had also been started on some vitamin B supplements recently. Although her leg had been red and swollen for the past 10 days, she was referred back to her primary care doctor. She was noted to have a significantly elevated D-dimer and underwent a CT of her chest today which showed an acute right-sided pulmonary embolism. She was, therefore, referred to the ER for further treatment and evaluation. The patient has been complaining of some exertional shortness of breath in the past 10 days but no hemoptysis, nor pleuritic chest pain, no nausea, no vomiting, no abdominal pain. No fevers, chills, cough. She does say that her left lower extremity has been a bit more swollen and red than usual.   REVIEW OF SYSTEMS: CONSTITUTIONAL: No documented fever. No weight gain or weight loss. EYES: No blurred or double vision. ENT: No tinnitus. No postnasal drip. No redness of the oropharynx. RESPIRATORY: No cough, no wheeze, no hemoptysis. Positive dyspnea. CARDIOVASCULAR: No chest pain, no orthopnea, no palpitations, no syncope. GASTROINTESTINAL: No nausea, no vomiting, no diarrhea, no abdominal pain, no melena or hematochezia. GU: No dysuria or hematuria. ENDOCRINE: No polyuria or nocturia. No heat or cold intolerance. HEMATOLOGIC: No anemia, no bruising, no bleeding. INTEGUMENTARY: No rashes.  No lesions. MUSCULOSKELETAL: No arthritis, no swelling, no gout. NEUROLOGIC: No numbness, no tingling, ataxia, no seizure-type activity. PSYCHIATRIC: No anxiety, no insomnia, no ADD.   PAST MEDICAL HISTORY:  1. Depression.  2. Hypertension.  3. Insomnia.  4. Restless leg syndrome.   ALLERGIES: No known drug allergies.   SOCIAL HISTORY: No smoking. No alcohol abuse. No illicit drug abuse. She lives at home with her husband.   FAMILY HISTORY: Mother is alive and healthy at age 19. Father died from complications of pulmonary fibrosis at age 53.   CURRENT MEDICATIONS:  1. Lexapro 20 mg daily.  2. HCTZ 25 mg daily.  3. Ibuprofen 800 mg t.i.d. as needed for headache.  4. NuvaRing vaginally monthly.  5. Requip 10 mg at bedtime.  6. Torsemide 20 mg daily.   PHYSICAL EXAMINATION:  VITAL SIGNS: On admission, temperature is 96, respirations 14, blood pressure 130/80, saturations 97% on room air.   GENERAL: She is a pleasant-appearing female in no apparent distress.   HEENT: Atraumatic, normocephalic. Extraocular muscles are intact. Pupils are equal, round reactive to light. Sclerae are anicteric. No conjunctival injection. No oropharyngeal erythema.   NECK: Supple. No jugular venous distention, no bruits, no lymphadenopathy, no thyromegaly.   HEART: Regular rate and rhythm. No murmurs, rubs, or clicks.   LUNGS: Clear to auscultation bilaterally. No rales, no rhonchi, no wheezes. She has poor inspiratory and expiratory effort.   ABDOMEN: Soft, flat, nondistended. Good bowel sounds. No hepatosplenomegaly appreciated.   EXTREMITIES: Her left lower extremity is significantly swollen and red. It is tender to touch. She has +2 pedal  and radial pulses bilaterally. No cyanosis. No clubbing.   SKIN: Moist and warm with no rashes.   LYMPHATIC: There is no cervical or axillary lymphadenopathy.   NEUROLOGIC: She is alert, awake, and oriented x3 with no focal motor or sensory deficits appreciated  bilaterally.   LABORATORY, DIAGNOSTIC AND RADIOLOGICAL DATA: White cell count 11, hemoglobin 13.6, hematocrit 40.8, platelet count 55, and INR is 1.0. Her metabolic profile is still currently pending. She did have a CT chest done with contrast today which showed extensive right-sided pulmonary emboli.   ASSESSMENT AND PLAN: This is a 49 year old female with a history of hypertension, depression, restless leg syndrome, insomnia, who presents to the hospital as she had been having significant left lower extremity redness, swelling and paresthesias for about 2 to 3 weeks, and now noted to have some shortness of breath for the past 10 days. She had a CT scan done as an outpatient which showed a positive right-sided pulmonary emboli.   1. Acute pulmonary embolus: Her likely risk factor is her being on birth control pills as she has a NuvaRing and her sedentary lifestyle. I think she still has some left lower extremity clot burden given her physical exam. Therefore, I will go ahead and get Dopplers of her lower extremities. She has received one dose of Lovenox here in the ER, and I will continue Lovenox for now. I did give her the option between Lovenox and Coumadin and being on Xarelto, and she is going to discuss it with her husband and find out insurance privileges prior to starting that. If she does decide to go with the Xarelto, then she may be able to be discharged tomorrow. If she still has significant clot burden in the left lower extremity, I will get a Vascular Surgery consult for possible placement of an inferior vena cava filter.  2. Hypertension: She is presently hemodynamically stable. I will continue hydrochlorothiazide and her torsemide.  3. Depression: I will continue her Lexapro.  4. Restless leg syndrome: Continue with Requip.   The patient will be transferred over to Dr. Ramond Craver service.        TIME SPENT ON ADMISSION: 50 minutes.  ____________________________ Belia Heman. Verdell Carmine,  MD vjs:cbb D: 12/14/2011 17:35:21 ET T: 12/14/2011 17:46:30 ET JOB#: [id number]  cc: Belia Heman. Verdell Carmine, MD, <Dictator> Adin Hector, MD  Henreitta Leber MD ELECTRONICALLY SIGNED 12/22/2011 14:57

## 2014-06-29 NOTE — Discharge Summary (Signed)
PATIENT NAME:  Brittney Diaz, IMHOFF MR#:  [id number] DATE OF BIRTH:  1965/05/25  DATE OF ADMISSION:  12/14/2011 DATE OF DISCHARGE:  12/16/2011  DISCHARGE DIAGNOSES:  1. Acute right pulmonary embolus.  2. Left lower extremity cellulitis with venous stasis dermatitis.  3. Restless leg syndrome.  4. Anxiety.   DISCHARGE MEDICATIONS:  1. NuvaRing monthly.  2. Mirapex 5 mg 2 tabs at bedtime.  3. Lexapro 20 mg daily.  4. Torsemide 20 mg daily.  5. Xarelto 15 mg b.i.d.   REASON FOR ADMISSION: 49 year old female presents with dyspnea and right pulmonary embolus with lower extremity edema. Please see history and physical for history of present illness, past medical history, physical exam.   HOSPITAL COURSE: Patient was admitted, initially placed on Lovenox and then per patient request was switched over to Xarelto. Her pulse oximetry was 96% on room air. She noticed improvement in her dyspnea. She had significant lower extremity edema which was treated with IV Lasix and electrolyte replacement. She will continue on the torsemide but will use knee-high compression socks as being a more effective way of controlling her lower extremity edema. She does have an echo pending to look at her right heart function next week. It was thought that her pulmonary emboli is most likely from her NuvaRing and she will contact her GYN about getting that removed and coming up with alternate plan. She was treated with IV Rocephin for some lower extremity cellulitis. The redness is gone this morning. Overall prognosis is good. Follow-up with Dr. Caryl Comes next week. ____________________________ Rusty Aus, MD mfm:cms D: 12/16/2011 09:05:29 ET T: 12/16/2011 10:56:06 ET  JOB#: [id number] cc: Rusty Aus, MD, <Dictator> MARK Roselee Culver MD ELECTRONICALLY SIGNED 12/19/2011 7:49

## 2014-07-26 ENCOUNTER — Encounter: Payer: Self-pay | Admitting: *Deleted

## 2014-07-28 ENCOUNTER — Encounter: Payer: Self-pay | Admitting: Anesthesiology

## 2014-07-28 ENCOUNTER — Ambulatory Visit
Admission: RE | Admit: 2014-07-28 | Discharge: 2014-07-28 | Disposition: A | Discharge status: Home / Self Care | Payer: BC Managed Care – PPO | Source: Ambulatory Visit | Attending: Gastroenterology | Admitting: Gastroenterology

## 2014-07-28 ENCOUNTER — Ambulatory Visit: Payer: BC Managed Care – PPO | Admitting: Student in an Organized Health Care Education/Training Program

## 2014-07-28 ENCOUNTER — Encounter
Admission: RE | Disposition: A | Discharge status: Home / Self Care | Payer: Self-pay | Source: Ambulatory Visit | Attending: Gastroenterology

## 2014-07-28 DIAGNOSIS — Z7952 Long term (current) use of systemic steroids: Secondary | ICD-10-CM | POA: Diagnosis not present

## 2014-07-28 DIAGNOSIS — Z79891 Long term (current) use of opiate analgesic: Secondary | ICD-10-CM | POA: Diagnosis not present

## 2014-07-28 DIAGNOSIS — M25559 Pain in unspecified hip: Secondary | ICD-10-CM | POA: Diagnosis not present

## 2014-07-28 DIAGNOSIS — F329 Major depressive disorder, single episode, unspecified: Secondary | ICD-10-CM | POA: Diagnosis not present

## 2014-07-28 DIAGNOSIS — K222 Esophageal obstruction: Secondary | ICD-10-CM | POA: Diagnosis not present

## 2014-07-28 DIAGNOSIS — Z9884 Bariatric surgery status: Secondary | ICD-10-CM | POA: Diagnosis not present

## 2014-07-28 DIAGNOSIS — M2660 Temporomandibular joint disorder, unspecified: Secondary | ICD-10-CM | POA: Diagnosis not present

## 2014-07-28 DIAGNOSIS — Z9889 Other specified postprocedural states: Secondary | ICD-10-CM | POA: Insufficient documentation

## 2014-07-28 DIAGNOSIS — R1310 Dysphagia, unspecified: Secondary | ICD-10-CM | POA: Insufficient documentation

## 2014-07-28 DIAGNOSIS — Z90710 Acquired absence of both cervix and uterus: Secondary | ICD-10-CM | POA: Diagnosis not present

## 2014-07-28 DIAGNOSIS — Z86711 Personal history of pulmonary embolism: Secondary | ICD-10-CM | POA: Diagnosis not present

## 2014-07-28 DIAGNOSIS — Z79899 Other long term (current) drug therapy: Secondary | ICD-10-CM | POA: Insufficient documentation

## 2014-07-28 HISTORY — DX: Acute embolism and thrombosis of unspecified deep veins of unspecified lower extremity: I82.409

## 2014-07-28 SURGERY — EGD (ESOPHAGOGASTRODUODENOSCOPY)
Anesthesia: Monitor Anesthesia Care | Wound class: Clean Contaminated

## 2014-07-28 MED ORDER — LACTATED RINGERS IV SOLN
INTRAVENOUS | Status: DC
  Administered 2014-07-28 (×2): via INTRAVENOUS

## 2014-07-28 MED ORDER — LIDOCAINE HCL (CARDIAC) 20 MG/ML IV SOLN
INTRAVENOUS | Status: DC | PRN
  Administered 2014-07-28: 30 mg via INTRAVENOUS

## 2014-07-28 MED ORDER — PROPOFOL 10 MG/ML IV BOLUS
INTRAVENOUS | Status: DC | PRN
  Administered 2014-07-28: 40 mg via INTRAVENOUS
  Administered 2014-07-28: 60 mg via INTRAVENOUS
  Administered 2014-07-28: 100 mg via INTRAVENOUS
  Administered 2014-07-28: 40 mg via INTRAVENOUS
  Administered 2014-07-28: 100 mg via INTRAVENOUS

## 2014-07-28 MED ORDER — GLYCOPYRROLATE 0.2 MG/ML IJ SOLN
INTRAMUSCULAR | Status: DC | PRN
  Administered 2014-07-28: 0.2 mg via INTRAVENOUS

## 2014-07-28 NOTE — Transfer of Care (Signed)
Immediate Anesthesia Transfer of Care Note  Patient: Brittney Diaz  Procedure(s) Performed: Procedure(s): ESOPHAGOGASTRODUODENOSCOPY (EGD) with dialation (N/A)  Patient Location: PACU  Anesthesia Type: MAC  Level of Consciousness: awake, alert  and patient cooperative  Airway and Oxygen Therapy: Patient Spontanous Breathing and Patient connected to supplemental oxygen  Post-op Assessment: Post-op Vital signs reviewed, Patient's Cardiovascular Status Stable, Respiratory Function Stable, Patent Airway and No signs of Nausea or vomiting  Post-op Vital Signs: Reviewed and stable  Complications: No apparent anesthesia complications

## 2014-07-28 NOTE — H&P (Signed)
[facility]  [address], [place], [place] [address] Phone: [phone] Fax : [phone]  Primary Care Physician:  Adin Hector, MD Primary Gastroenterologist:  Dr. Allen Norris  Pre-Procedure History & Physical: HPI:  Brittney Diaz is a 49 y.o. female is here for an endoscopy.   Past Medical History  Diagnosis Date  . Pulmonary embolism 2013    from [employer]  . Depression   . Hip pain, acute     poss from long term use of Prednisone  . Headache(784.0)     migraines  . 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 DVT (deep venous thrombosis)     while on BCP    Past Surgical History  Procedure Laterality Date  . Gastric bypass      12/14  . Nasal sinus surgery    . Femur fracture surgery    . Dilitation & currettage/hystroscopy with novasure ablation N/A 07/30/2013    Procedure: [place], AND POLYPECTOMY;  Surgeon: Lovenia Kim, MD;  Location: [facility];  Service: Gynecology;  Laterality: N/A;  . Hysteroscopy w/ endometrial ablation      Prior to Admission medications   Medication Sig Start Date End Date Taking? Authorizing Provider  acetaminophen (TYLENOL) 325 MG tablet Take 325 mg by mouth every 6 (six) hours as needed for mild pain.   Yes Historical Provider, MD  FLUoxetine (PROZAC) 40 MG capsule Take 40 mg by mouth daily. 2 caps daily, AM   Yes Historical Provider, MD  metolazone (ZAROXOLYN) 2.5 MG tablet Take 2.5 mg by mouth every other day.   Yes Historical Provider, MD  oxycodone (OXY-IR) 5 MG capsule Take 5 mg by mouth every 4 (four) hours as needed.   Yes Historical Provider, MD  rotigotine (NEUPRO) 6 MG/24HR Place 1 patch onto the skin daily.   Yes Historical Provider, MD  sucralfate (CARAFATE) 1 GM/10ML suspension Take 1 g by mouth 3 (three) times daily.   Yes Historical Provider, MD  torsemide (DEMADEX) 20 MG tablet Take 20 mg by mouth every other day as needed (fluid).   Yes Historical  Provider, MD  cimetidine (TAGAMET) 200 MG tablet Take 200 mg by mouth as needed (heartburn).    Historical Provider, MD  HYDROcodone-acetaminophen (NORCO/VICODIN) 5-325 MG per tablet Take 1-2 tablets by mouth every 6 (six) hours as needed for moderate pain. 07/30/13   Brien Few, MD  Multiple Vitamin (MULTIVITAMIN) capsule Take 1 capsule by mouth daily.    Historical Provider, MD  omeprazole (PRILOSEC) 20 MG capsule Take 20 mg by mouth 2 (two) times daily before a meal.    Historical Provider, MD  potassium chloride (K-DUR) 10 MEQ tablet Take 10 mEq by mouth every other day as needed (takes on days with torsemide).    Historical Provider, MD  traMADol (ULTRAM) 50 MG tablet Take 1-2 tablets (50-100 mg total) by mouth every 6 (six) hours as needed for moderate pain. 07/30/13   Brien Few, MD    Allergies as of 07/26/2014  . (No Known Allergies)    History reviewed. No pertinent family history.  History   Social History  . Marital Status: Married    Spouse Name: N/A  . Number of Children: N/A  . Years of Education: N/A   Occupational History  . Not on file.   Social History Main Topics  . Smoking status: Never Smoker   . Smokeless tobacco: Never Used  .  Alcohol Use: No  . Drug Use: No  . Sexual Activity: Not on file   Other Topics Concern  . Not on file   Social History Narrative    Review of Systems: See HPI, otherwise negative ROS  Physical Exam: BP 101/65 mmHg  Pulse 61  Temp(Src) 97.7 F (36.5 C)  Resp 17  Ht 5\' 6"  (1.676 m)  Wt 181 lb (82.101 kg)  BMI 29.23 kg/m2  SpO2 95% General:   Alert,  pleasant and cooperative in NAD Head:  Normocephalic and atraumatic. Neck:  Supple; no masses or thyromegaly. Lungs:  Clear throughout to auscultation.    Heart:  Regular rate and rhythm. Abdomen:  Soft, nontender and nondistended. Normal bowel sounds, without guarding, and without rebound.   Neurologic:  Alert and  oriented x4;  grossly normal  neurologically.  Impression/Plan: Brittney Diaz is here for an endoscopy to be performed for Dysphagia  Risks, benefits, limitations, and alternatives regarding  endoscopy have been reviewed with the patient.  Questions have been answered.  All parties agreeable.   [facility], MD  07/28/2014, 8:43 AM

## 2014-07-28 NOTE — Anesthesia Procedure Notes (Signed)
Procedure Name: MAC Performed by: Gavynn Duvall Pre-anesthesia Checklist: Patient identified, Emergency Drugs available, Suction available, Patient being monitored and Timeout performed Patient Re-evaluated:Patient Re-evaluated prior to inductionOxygen Delivery Method: Nasal cannula       

## 2014-07-28 NOTE — Op Note (Signed)
[facility] Patient Name: Brittney Diaz Procedure Date: 07/28/2014 8:34 AM MRN: [id number] Account #: [id number] Date of Birth: 12/22/1965 Admit Type: Outpatient Age: 49 Room: [facility] OR ROOM 01 Gender: Female Note Status: Finalized Procedure:         Upper GI endoscopy Indications:       Dysphagia Providers:         Lucilla Lame, MD Referring MD:      Venia Carbon. Tyner (Referring MD) Medicines:         Propofol per Anesthesia Complications:     No immediate complications. Procedure:         Pre-Anesthesia Assessment:                    - Prior to the procedure, a History and Physical was                     performed, and patient medications and allergies were                     reviewed. The patient's tolerance of previous anesthesia                     was also reviewed. The risks and benefits of the procedure                     and the sedation options and risks were discussed with the                     patient. All questions were answered, and informed consent                     was obtained. Prior Anticoagulants: The patient has taken                     no previous anticoagulant or antiplatelet agents. ASA                     Grade Assessment: II - A patient with mild systemic                     disease. After reviewing the risks and benefits, the                     patient was deemed in satisfactory condition to undergo                     the procedure.                    After obtaining informed consent, the endoscope was passed                     under direct vision. Throughout the procedure, the                     patient's blood pressure, pulse, and oxygen saturations                     were monitored continuously. The Olympus GIF-HQ190                     Endoscope (S#. [id number]) was introduced through the mouth,  and advanced to the second part of duodenum. The upper GI                     endoscopy was  accomplished without difficulty. The patient                     tolerated the procedure well. Findings:      The examined esophagus was normal.      Evidence of a gastric bypass was found. A gastric pouch was found. The       gastrojejunal anastomosis was characterized by moderate stenosis. This       was traversed. A TTS dilator was passed through the scope. Dilation with       a 12-13.5-15 mm balloon (to a maximum balloon size of 15 mm) dilator was       performed. The dilation site was examined following endoscope       reinsertion and showed moderate improvement in luminal narrowing.      The examined jejunum was normal. Impression:        - Normal esophagus.                    - Gastric bypass with anastomotic stricture. Dilated.                    - Normal examined jejunum.                    - No specimens collected. Recommendation:    - Repeat the upper endoscopy PRN for retreatment. Procedure Code(s): --- Professional ---                    [phone], Esophagogastroduodenoscopy, flexible, transoral;                     with dilation of gastric/duodenal stricture(s) (eg,                     balloon, bougie) Diagnosis Code(s): --- Professional ---                    R13.10, Dysphagia, unspecified                    Z98.84, Bariatric surgery status CPT copyright 2014 American Medical Association. All rights reserved. The codes documented in this report are preliminary and upon coder review may  be revised to meet current compliance requirements. Lucilla Lame, MD 07/28/2014 8:59:50 AM This report has been signed electronically. Number of Addenda: 0 Note Initiated On: 07/28/2014 8:34 AM Total Procedure Duration: 0 hours 4 minutes 49 seconds       [facility]

## 2014-07-28 NOTE — Anesthesia Postprocedure Evaluation (Signed)
  Anesthesia Post-op Note  Patient: Brittney Diaz  Procedure(s) Performed: Procedure(s): ESOPHAGOGASTRODUODENOSCOPY (EGD) with dialation (N/A)  Anesthesia type:MAC  Patient location: PACU  Post pain: Pain level controlled  Post assessment: Post-op Vital signs reviewed, Patient's Cardiovascular Status Stable, Respiratory Function Stable, Patent Airway and No signs of Nausea or vomiting  Post vital signs: Reviewed and stable  Last Vitals:  Filed Vitals:   07/28/14 0922  BP: 101/55  Pulse: 83  Temp: 36.3 C  Resp: 20    Level of consciousness: awake, alert  and patient cooperative  Complications: No apparent anesthesia complications

## 2014-07-28 NOTE — Anesthesia Preprocedure Evaluation (Signed)
Anesthesia Evaluation  Patient identified by MRN, date of birth, ID band  Reviewed: Allergy & Precautions, H&P , NPO status , Patient's Chart, lab work & pertinent test results  Airway Mallampati: II  TM Distance: >3 FB Neck ROM: full    Dental no notable dental hx.    Pulmonary    Pulmonary exam normal       Cardiovascular Rhythm:regular Rate:Normal     Neuro/Psych PSYCHIATRIC DISORDERS    GI/Hepatic   Endo/Other    Renal/GU      Musculoskeletal   Abdominal   Peds  Hematology   Anesthesia Other Findings   Reproductive/Obstetrics                             Anesthesia Physical Anesthesia Plan  ASA: II  Anesthesia Plan: MAC   Post-op Pain Management:    Induction:   Airway Management Planned:   Additional Equipment:   Intra-op Plan:   Post-operative Plan:   Informed Consent: I have reviewed the patients History and Physical, chart, labs and discussed the procedure including the risks, benefits and alternatives for the proposed anesthesia with the patient or authorized representative who has indicated his/her understanding and acceptance.     Plan Discussed with: CRNA  Anesthesia Plan Comments:         Anesthesia Quick Evaluation

## 2014-08-23 DIAGNOSIS — D509 Iron deficiency anemia, unspecified: Secondary | ICD-10-CM | POA: Insufficient documentation

## 2015-01-19 ENCOUNTER — Ambulatory Visit
Admission: RE | Admit: 2015-01-19 | Discharge: 2015-01-19 | Disposition: A | Discharge status: Home / Self Care | Payer: BC Managed Care – PPO | Source: Ambulatory Visit | Attending: Psychiatry | Admitting: Psychiatry

## 2015-01-19 ENCOUNTER — Other Ambulatory Visit
Admission: RE | Admit: 2015-01-19 | Discharge: 2015-01-19 | Disposition: A | Discharge status: Home / Self Care | Payer: BC Managed Care – PPO | Source: Ambulatory Visit | Attending: Psychiatry | Admitting: Psychiatry

## 2015-01-19 DIAGNOSIS — F39 Unspecified mood [affective] disorder: Secondary | ICD-10-CM | POA: Diagnosis not present

## 2015-01-19 LAB — CBC
HCT: 41.5 % (ref 35.0–47.0)
Hemoglobin: 13.5 g/dL (ref 12.0–16.0)
MCH: 31.9 pg (ref 26.0–34.0)
MCHC: 32.6 g/dL (ref 32.0–36.0)
MCV: 97.9 fL (ref 80.0–100.0)
PLATELETS: 355 10*3/uL (ref 150–440)
RBC: 4.24 MIL/uL (ref 3.80–5.20)
RDW: 13.4 % (ref 11.5–14.5)
WBC: 10.2 10*3/uL (ref 3.6–11.0)

## 2015-01-19 LAB — URINALYSIS COMPLETE WITH MICROSCOPIC (ARMC ONLY)
BILIRUBIN URINE: NEGATIVE
Bacteria, UA: NONE SEEN
GLUCOSE, UA: NEGATIVE mg/dL
Hgb urine dipstick: NEGATIVE
KETONES UR: NEGATIVE mg/dL
NITRITE: NEGATIVE
PROTEIN: NEGATIVE mg/dL
Specific Gravity, Urine: 1.021 (ref 1.005–1.030)
pH: 7 (ref 5.0–8.0)

## 2015-01-19 LAB — TSH: TSH: 1.628 u[IU]/mL (ref 0.350–4.500)

## 2015-01-19 LAB — RENAL FUNCTION PANEL
ALBUMIN: 4 g/dL (ref 3.5–5.0)
Anion gap: 13 (ref 5–15)
BUN: 21 mg/dL — ABNORMAL HIGH (ref 6–20)
CALCIUM: 8.8 mg/dL — AB (ref 8.9–10.3)
CO2: 30 mmol/L (ref 22–32)
CREATININE: 0.91 mg/dL (ref 0.44–1.00)
Chloride: 95 mmol/L — ABNORMAL LOW (ref 101–111)
GFR calc Af Amer: >60 mL/min (ref >60)
GFR calc non Af Amer: >60 mL/min (ref >60)
Glucose, Bld: 98 mg/dL (ref 65–99)
PHOSPHORUS: 4.4 mg/dL (ref 2.5–4.6)
Potassium: 3.3 mmol/L — ABNORMAL LOW (ref 3.5–5.1)
SODIUM: 138 mmol/L (ref 135–145)

## 2015-01-20 LAB — VITAMIN B12: Vitamin B-12: 182 pg/mL (ref 180–914)

## 2015-01-23 LAB — VITAMIN D 1,25 DIHYDROXY
VITAMIN D 1, 25 (OH) TOTAL: 93 pg/mL
VITAMIN D3 1, 25 (OH): 90 pg/mL

## 2015-02-23 ENCOUNTER — Telehealth: Payer: Self-pay | Admitting: Gastroenterology

## 2015-02-23 ENCOUNTER — Other Ambulatory Visit: Payer: Self-pay

## 2015-02-23 NOTE — Telephone Encounter (Signed)
Pt scheduled for EGD at [facility] on 03/03/15. Pt given instructions over the phone.

## 2015-02-23 NOTE — Telephone Encounter (Signed)
Patient called and just left Dr.Tyner's office and wanted an appt here for trouble swallowing. Patient just had EGD 5/16 can she just got for another one or does she need an office appt?

## 2015-02-24 ENCOUNTER — Telehealth: Payer: Self-pay | Admitting: Gastroenterology

## 2015-02-24 NOTE — Telephone Encounter (Signed)
Left voice message for patient to call and schedule appointment with Dr. Allen Norris for Dysphagia, possible EGD. Notes under media

## 2015-03-01 ENCOUNTER — Other Ambulatory Visit: Payer: Self-pay

## 2015-03-01 DIAGNOSIS — G43909 Migraine, unspecified, not intractable, without status migrainosus: Secondary | ICD-10-CM | POA: Insufficient documentation

## 2015-03-01 DIAGNOSIS — N938 Other specified abnormal uterine and vaginal bleeding: Secondary | ICD-10-CM | POA: Insufficient documentation

## 2015-03-01 DIAGNOSIS — N390 Urinary tract infection, site not specified: Secondary | ICD-10-CM | POA: Insufficient documentation

## 2015-03-01 DIAGNOSIS — M87059 Idiopathic aseptic necrosis of unspecified femur: Secondary | ICD-10-CM | POA: Insufficient documentation

## 2015-03-01 DIAGNOSIS — M26629 Arthralgia of temporomandibular joint, unspecified side: Secondary | ICD-10-CM | POA: Insufficient documentation

## 2015-03-01 DIAGNOSIS — E538 Deficiency of other specified B group vitamins: Secondary | ICD-10-CM | POA: Insufficient documentation

## 2015-03-01 NOTE — Discharge Instructions (Signed)

## 2015-03-03 ENCOUNTER — Ambulatory Visit: Payer: BC Managed Care – PPO | Admitting: Anesthesiology

## 2015-03-03 ENCOUNTER — Encounter: Payer: Self-pay | Admitting: Anesthesiology

## 2015-03-03 ENCOUNTER — Ambulatory Visit
Admission: RE | Admit: 2015-03-03 | Discharge: 2015-03-03 | Disposition: A | Discharge status: Home / Self Care | Payer: BC Managed Care – PPO | Source: Ambulatory Visit | Attending: Gastroenterology | Admitting: Gastroenterology

## 2015-03-03 ENCOUNTER — Encounter
Admission: RE | Disposition: A | Discharge status: Home / Self Care | Payer: Self-pay | Source: Ambulatory Visit | Attending: Gastroenterology

## 2015-03-03 DIAGNOSIS — Z86711 Personal history of pulmonary embolism: Secondary | ICD-10-CM | POA: Insufficient documentation

## 2015-03-03 DIAGNOSIS — R1310 Dysphagia, unspecified: Secondary | ICD-10-CM | POA: Diagnosis not present

## 2015-03-03 DIAGNOSIS — Z980 Intestinal bypass and anastomosis status: Secondary | ICD-10-CM | POA: Diagnosis not present

## 2015-03-03 DIAGNOSIS — Z9884 Bariatric surgery status: Secondary | ICD-10-CM | POA: Diagnosis not present

## 2015-03-03 DIAGNOSIS — F329 Major depressive disorder, single episode, unspecified: Secondary | ICD-10-CM | POA: Diagnosis not present

## 2015-03-03 DIAGNOSIS — K219 Gastro-esophageal reflux disease without esophagitis: Secondary | ICD-10-CM | POA: Diagnosis not present

## 2015-03-03 DIAGNOSIS — K3189 Other diseases of stomach and duodenum: Secondary | ICD-10-CM | POA: Diagnosis not present

## 2015-03-03 DIAGNOSIS — Z96643 Presence of artificial hip joint, bilateral: Secondary | ICD-10-CM | POA: Insufficient documentation

## 2015-03-03 DIAGNOSIS — Z79899 Other long term (current) drug therapy: Secondary | ICD-10-CM | POA: Diagnosis not present

## 2015-03-03 DIAGNOSIS — Z86718 Personal history of other venous thrombosis and embolism: Secondary | ICD-10-CM | POA: Diagnosis not present

## 2015-03-03 HISTORY — PX: ESOPHAGOGASTRODUODENOSCOPY (EGD) WITH PROPOFOL: SHX5813

## 2015-03-03 SURGERY — ESOPHAGOGASTRODUODENOSCOPY (EGD) WITH PROPOFOL
Anesthesia: Monitor Anesthesia Care | Wound class: Clean Contaminated

## 2015-03-03 MED ORDER — LACTATED RINGERS IV SOLN
INTRAVENOUS | Status: DC
  Administered 2015-03-03: 10:00:00 via INTRAVENOUS

## 2015-03-03 MED ORDER — STERILE WATER FOR IRRIGATION IR SOLN
Status: DC | PRN
  Administered 2015-03-03: 11:00:00

## 2015-03-03 MED ORDER — PROPOFOL 10 MG/ML IV BOLUS
INTRAVENOUS | Status: DC | PRN
  Administered 2015-03-03 (×2): 40 mg via INTRAVENOUS
  Administered 2015-03-03: 80 mg via INTRAVENOUS
  Administered 2015-03-03: 20 mg via INTRAVENOUS

## 2015-03-03 MED ORDER — LIDOCAINE HCL (CARDIAC) 20 MG/ML IV SOLN
INTRAVENOUS | Status: DC | PRN
  Administered 2015-03-03: 40 mg via INTRAVENOUS

## 2015-03-03 MED ORDER — GLYCOPYRROLATE 0.2 MG/ML IJ SOLN
INTRAMUSCULAR | Status: DC | PRN
  Administered 2015-03-03: .1 mg via INTRAVENOUS

## 2015-03-03 SURGICAL SUPPLY — 39 items
BALLN DILATOR 10-12 8 (BALLOONS)
BALLN DILATOR 12-15 8 (BALLOONS)
BALLN DILATOR 15-18 8 (BALLOONS) ×3
BALLN DILATOR CRE 0-12 8 (BALLOONS)
BALLN DILATOR ESOPH 8 10 CRE (MISCELLANEOUS) IMPLANT
BALLOON DILATOR 12-15 8 (BALLOONS) IMPLANT
BALLOON DILATOR 15-18 8 (BALLOONS) ×1 IMPLANT
BALLOON DILATOR CRE 0-12 8 (BALLOONS) IMPLANT
BLOCK BITE 60FR ADLT L/F GRN (MISCELLANEOUS) ×3 IMPLANT
CANISTER SUCT 1200ML W/VALVE (MISCELLANEOUS) ×3 IMPLANT
FCP ESCP3.2XJMB 240X2.8X (MISCELLANEOUS)
FORCEPS BIOP RAD 4 LRG CAP 4 (CUTTING FORCEPS) IMPLANT
FORCEPS BIOP RJ4 240 W/NDL (MISCELLANEOUS)
FORCEPS ESCP3.2XJMB 240X2.8X (MISCELLANEOUS) IMPLANT
GOWN CVR UNV OPN BCK APRN NK (MISCELLANEOUS) ×2 IMPLANT
GOWN ISOL THUMB LOOP REG UNIV (MISCELLANEOUS) ×4
HEMOCLIP INSTINCT (CLIP) IMPLANT
INJECTOR VARIJECT VIN23 (MISCELLANEOUS) IMPLANT
KIT CO2 TUBING (TUBING) IMPLANT
KIT DEFENDO VALVE AND CONN (KITS) IMPLANT
KIT ENDO PROCEDURE OLY (KITS) ×3 IMPLANT
LIGATOR MULTIBAND 6SHOOTER MBL (MISCELLANEOUS) IMPLANT
MARKER SPOT ENDO TATTOO 5ML (MISCELLANEOUS) IMPLANT
PAD GROUND ADULT SPLIT (MISCELLANEOUS) IMPLANT
SNARE SHORT THROW 13M SML OVAL (MISCELLANEOUS) IMPLANT
SNARE SHORT THROW 30M LRG OVAL (MISCELLANEOUS) IMPLANT
SPOT EX ENDOSCOPIC TATTOO (MISCELLANEOUS)
SUCTION POLY TRAP 4CHAMBER (MISCELLANEOUS) IMPLANT
SYR INFLATION 60ML (SYRINGE) ×3 IMPLANT
TRAP SUCTION POLY (MISCELLANEOUS) IMPLANT
TUBING CONN 6MMX3.1M (TUBING)
TUBING SUCTION CONN 0.25 STRL (TUBING) IMPLANT
UNDERPAD 30X60 958B10 (PK) (MISCELLANEOUS) IMPLANT
VALVE BIOPSY ENDO (VALVE) IMPLANT
VARIJECT INJECTOR VIN23 (MISCELLANEOUS)
WATER AUXILLARY (MISCELLANEOUS) IMPLANT
WATER STERILE IRR 250ML POUR (IV SOLUTION) ×3 IMPLANT
WATER STERILE IRR 500ML POUR (IV SOLUTION) IMPLANT
WIRE CRE 18-20MM 8CM F G (MISCELLANEOUS) IMPLANT

## 2015-03-03 NOTE — H&P (Signed)
[facility]  [address] [place], [place] [address] Phone: [phone] Fax : [phone]  Primary Care Physician:  Adin Hector, MD Primary Gastroenterologist:  Dr. Allen Norris  Pre-Procedure History & Physical: HPI:  Brittney Diaz is a 49 y.o. female is here for an endoscopy.   Past Medical History  Diagnosis Date  . Pulmonary embolism ([place]) 2013    from [employer]  . Depression   . Hip pain, acute     poss from long term use of Prednisone  . 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 DVT (deep venous thrombosis) (H[facility])     while on BCP/ LEG  . Headache(784.0)     migraines/ RARELY NOW    Past Surgical History  Procedure Laterality Date  . Gastric bypass      12/14  . Nasal sinus surgery    . Femur fracture surgery Right 1989  . Dilitation & currettage/hystroscopy with novasure ablation N/A 07/30/2013    Procedure: [place], AND POLYPECTOMY;  Surgeon: Lovenia Kim, MD;  Location: [facility];  Service: Gynecology;  Laterality: N/A;  . Hysteroscopy w/ endometrial ablation    . Total hip arthroplasty Bilateral 2015    Prior to Admission medications   Medication Sig Start Date End Date Taking? Authorizing Provider  acetaminophen (TYLENOL) 325 MG tablet Take 325 mg by mouth every 6 (six) hours as needed for mild pain.   Yes Historical Provider, MD  diclofenac sodium (VOLTAREN) 1 % GEL Apply topically. 12/21/14 12/21/15 Yes Historical Provider, MD  LORazepam (ATIVAN) 0.5 MG tablet  02/02/15  Yes Historical Provider, MD  metolazone (ZAROXOLYN) 2.5 MG tablet Take 2.5 mg by mouth every other day.   Yes Historical Provider, MD  mirtazapine (REMERON) 30 MG tablet  02/01/15  Yes Historical Provider, MD  Multiple Vitamin (MULTIVITAMIN) capsule Take 1 capsule by mouth daily.   Yes Historical Provider, MD  omeprazole (PRILOSEC) 20 MG capsule Take 20 mg by mouth 2 (two) times daily before a meal. EVE   Yes  Historical Provider, MD  oxycodone (OXY-IR) 5 MG capsule Take 5 mg by mouth every 4 (four) hours as needed. BEDTIME   Yes Historical Provider, MD  potassium chloride (K-DUR) 10 MEQ tablet Take 10 mEq by mouth every other day as needed (takes on days with torsemide).   Yes Historical Provider, MD  PRESCRIPTION MEDICATION 100 mg. PRISTIQUE AM   Yes Historical Provider, MD  PRISTIQ 50 MG 24 hr tablet  12/17/14  Yes Historical Provider, MD  Rotigotine (NEUPRO) 8 MG/24HR PT24 Place 1 patch onto the skin.   Yes Historical Provider, MD  sucralfate (CARAFATE) 1 GM/10ML suspension Take 1 g by mouth as needed.    Yes Historical Provider, MD  torsemide (DEMADEX) 20 MG tablet Take 20 mg by mouth every other day as needed (fluid).   Yes Historical Provider, MD  gabapentin (NEURONTIN) 300 MG capsule Reported on 03/03/2015 12/13/14   Historical Provider, MD  HYDROcodone-acetaminophen (NORCO/VICODIN) 5-325 MG per tablet Take 1-2 tablets by mouth every 6 (six) hours as needed for moderate pain. Patient not taking: Reported on 03/03/2015 07/30/13   Brien Few, MD  lithium carbonate (ESKALITH) 450 MG CR tablet Reported on 03/03/2015 01/26/15   Historical Provider, MD  risperiDONE (RISPERDAL) 0.5 MG tablet Reported on 03/03/2015 01/19/15   Historical Provider, MD    Allergies as of 02/23/2015  . (No Known Allergies)    Family History  Problem Relation Age of Onset  . Pulmonary fibrosis Father   . Breast cancer Maternal Grandmother   . Heart disease Paternal Grandfather     Social History   Social History  . Marital Status: Married    Spouse Name: N/A  . Number of Children: N/A  . Years of Education: N/A   Occupational History  . Not on file.   Social History Main Topics  . Smoking status: Never Smoker   . Smokeless tobacco: Never Used  . Alcohol Use: No  . Drug Use: No  . Sexual Activity: Not on file   Other Topics Concern  . Not on file   Social History Narrative    Review of  Systems: See HPI, otherwise negative ROS  Physical Exam: BP 105/66 mmHg  Pulse 78  Temp(Src) 97.9 F (36.6 C) (Temporal)  Resp 16  Ht 5\' 6"  (1.676 m)  Wt 194 lb (87.998 kg)  BMI 31.33 kg/m2  SpO2 100% General:   Alert,  pleasant and cooperative in NAD Head:  Normocephalic and atraumatic. Neck:  Supple; no masses or thyromegaly. Lungs:  Clear throughout to auscultation.    Heart:  Regular rate and rhythm. Abdomen:  Soft, nontender and nondistended. Normal bowel sounds, without guarding, and without rebound.   Neurologic:  Alert and  oriented x4;  grossly normal neurologically.  Impression/Plan: Brittney Diaz is here for an endoscopy to be performed for dysphagia  Risks, benefits, limitations, and alternatives regarding  endoscopy have been reviewed with the patient.  Questions have been answered.  All parties agreeable.   Ollen Bowl, MD  03/03/2015, 10:24 AM

## 2015-03-03 NOTE — Op Note (Signed)
[facility] Patient Name: Brittney Diaz Procedure Date: 03/03/2015 10:20 AM MRN: [id number] Account #: [id number] Date of Birth: 12/08/1965 Admit Type: Outpatient Age: 49 Room: [facility] OR ROOM 01 Gender: Female Note Status: Finalized Procedure:         Upper GI endoscopy Indications:       Dysphagia Providers:         Lucilla Lame, MD Referring MD:      Ramonita Lab, MD (Referring MD) Medicines:         Propofol per Anesthesia Complications:     No immediate complications. Procedure:         Pre-Anesthesia Assessment:                    - Prior to the procedure, a History and Physical was                     performed, and patient medications and allergies were                     reviewed. The patient's tolerance of previous anesthesia                     was also reviewed. The risks and benefits of the procedure                     and the sedation options and risks were discussed with the                     patient. All questions were answered, and informed consent                     was obtained. Prior Anticoagulants: The patient has taken                     no previous anticoagulant or antiplatelet agents. ASA                     Grade Assessment: II - A patient with mild systemic                     disease. After reviewing the risks and benefits, the                     patient was deemed in satisfactory condition to undergo                     the procedure.                    After obtaining informed consent, the endoscope was passed                     under direct vision. Throughout the procedure, the                     patient's blood pressure, pulse, and oxygen saturations                     were monitored continuously. The Olympus GIF H180J                     colonscope [id number]) was introduced through the mouth,  and advanced to the efferent jejunal loop. The upper GI                     endoscopy was  accomplished without difficulty. The patient                     tolerated the procedure well. Findings:      The examined esophagus was normal.      Evidence of a Roux-en-Y gastrojejunostomy was found. The gastrojejunal       anastomosis was characterized by mild stenosis. This was traversed. A       TTS dilator was passed through the scope. Dilation with a 15-16.5-18 mm       balloon (to a maximum balloon size of 18 mm) dilator was performed. The       dilation site was examined following endoscope reinsertion and showed       complete resolution of luminal narrowing.      The examined jejunum was normal. Impression:        - Normal esophagus.                    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 Dilated.                    - Normal examined jejunum.                    - No specimens collected. Recommendation:    - Repeat the upper endoscopy PRN for retreatment. Procedure Code(s): --- Professional ---                    [phone], Esophagogastroduodenoscopy, flexible, transoral;                     with dilation of gastric/duodenal stricture(s) (eg,                     balloon, bougie) Diagnosis Code(s): --- Professional ---                    R13.10, Dysphagia, unspecified                    Z98.0, Intestinal bypass and anastomosis status CPT copyright 2014 American Medical Association. All rights reserved. The codes documented in this report are preliminary and upon coder review may  be revised to meet current compliance requirements. Lucilla Lame, MD 03/03/2015 10:44:42 AM This report has been signed electronically. Number of Addenda: 0 Note Initiated On: 03/03/2015 10:20 AM Total Procedure Duration: 0 hours 5 minutes 25 seconds       [facility]

## 2015-03-03 NOTE — Anesthesia Procedure Notes (Signed)
Procedure Name: MAC Performed by: Loletta Harper Pre-anesthesia Checklist: Patient identified, Emergency Drugs available, Suction available, Timeout performed and Patient being monitored Patient Re-evaluated:Patient Re-evaluated prior to inductionOxygen Delivery Method: Nasal cannula Placement Confirmation: positive ETCO2       

## 2015-03-03 NOTE — Anesthesia Preprocedure Evaluation (Signed)
Anesthesia Evaluation  Patient identified by MRN, date of birth, ID band Patient awake    Reviewed: Allergy & Precautions, H&P , NPO status , Patient's Chart, lab work & pertinent test results, reviewed documented beta blocker date and time   Airway Mallampati: II  TM Distance: >3 FB Neck ROM: full    Dental no notable dental hx.    Pulmonary neg pulmonary ROS,    Pulmonary exam normal breath sounds clear to auscultation       Cardiovascular Exercise Tolerance: Good negative cardio ROS   Rhythm:regular Rate:Normal     Neuro/Psych  Headaches, negative psych ROS   GI/Hepatic Neg liver ROS, GERD  ,  Endo/Other  negative endocrine ROS  Renal/GU negative Renal ROS  negative genitourinary   Musculoskeletal   Abdominal   Peds  Hematology  (+) anemia ,   Anesthesia Other Findings   Reproductive/Obstetrics negative OB ROS                             Anesthesia Physical Anesthesia Plan  ASA: II  Anesthesia Plan: MAC   Post-op Pain Management:    Induction:   Airway Management Planned:   Additional Equipment:   Intra-op Plan:   Post-operative Plan:   Informed Consent: I have reviewed the patients History and Physical, chart, labs and discussed the procedure including the risks, benefits and alternatives for the proposed anesthesia with the patient or authorized representative who has indicated his/her understanding and acceptance.   Dental Advisory Given  Plan Discussed with: CRNA  Anesthesia Plan Comments:         Anesthesia Quick Evaluation

## 2015-03-03 NOTE — Transfer of Care (Signed)
Immediate Anesthesia Transfer of Care Note  Patient: Brittney Diaz  Procedure(s) Performed: Procedure(s): ESOPHAGOGASTRODUODENOSCOPY (EGD) WITH PROPOFOL (N/A)  Patient Location: PACU  Anesthesia Type: MAC  Level of Consciousness: awake, alert  and patient cooperative  Airway and Oxygen Therapy: Patient Spontanous Breathing and Patient connected to supplemental oxygen  Post-op Assessment: Post-op Vital signs reviewed, Patient's Cardiovascular Status Stable, Respiratory Function Stable, Patent Airway and No signs of Nausea or vomiting  Post-op Vital Signs: Reviewed and stable  Complications: No apparent anesthesia complications

## 2015-03-03 NOTE — Anesthesia Postprocedure Evaluation (Signed)
Anesthesia Post Note  Patient: Brittney Diaz  Procedure(s) Performed: Procedure(s) (LRB): ESOPHAGOGASTRODUODENOSCOPY (EGD) WITH PROPOFOL with balloon dilation (N/A)  Patient location during evaluation: PACU Anesthesia Type: MAC Level of consciousness: awake and alert Pain management: pain level controlled Vital Signs Assessment: post-procedure vital signs reviewed and stable Respiratory status: spontaneous breathing, nonlabored ventilation, respiratory function stable and patient connected to nasal cannula oxygen Cardiovascular status: blood pressure returned to baseline and stable Postop Assessment: no signs of nausea or vomiting Anesthetic complications: no    Alisa Graff

## 2015-03-04 ENCOUNTER — Encounter: Payer: Self-pay | Admitting: Gastroenterology

## 2015-07-13 ENCOUNTER — Ambulatory Visit
Admission: RE | Admit: 2015-07-13 | Discharge: 2015-07-13 | Disposition: A | Discharge status: Home / Self Care | Payer: BC Managed Care – PPO | Source: Ambulatory Visit | Attending: Bariatrics | Admitting: Bariatrics

## 2015-07-13 ENCOUNTER — Other Ambulatory Visit: Payer: Self-pay | Admitting: Bariatrics

## 2015-07-13 DIAGNOSIS — R109 Unspecified abdominal pain: Secondary | ICD-10-CM | POA: Insufficient documentation

## 2015-07-13 DIAGNOSIS — R1011 Right upper quadrant pain: Secondary | ICD-10-CM

## 2015-07-15 ENCOUNTER — Other Ambulatory Visit: Payer: Self-pay | Admitting: Bariatrics

## 2015-07-15 DIAGNOSIS — R1011 Right upper quadrant pain: Secondary | ICD-10-CM

## 2015-07-21 ENCOUNTER — Ambulatory Visit
Admission: RE | Admit: 2015-07-21 | Discharge: 2015-07-21 | Disposition: A | Discharge status: Home / Self Care | Payer: BC Managed Care – PPO | Source: Ambulatory Visit | Attending: Bariatrics | Admitting: Bariatrics

## 2015-07-21 DIAGNOSIS — R1011 Right upper quadrant pain: Secondary | ICD-10-CM | POA: Diagnosis not present

## 2015-07-21 MED ORDER — TECHNETIUM TC 99M MEBROFENIN IV KIT
5.0000 | PACK | Freq: Once | INTRAVENOUS | Status: AC | PRN
  Administered 2015-07-21: 5.23 via INTRAVENOUS

## 2015-07-21 MED ORDER — SINCALIDE 5 MCG IJ SOLR
0.0200 ug/kg | Freq: Once | INTRAMUSCULAR | Status: AC
  Administered 2015-07-21: 1.7 ug via INTRAVENOUS

## 2015-11-09 ENCOUNTER — Other Ambulatory Visit: Payer: Self-pay | Admitting: Bariatrics

## 2015-11-09 DIAGNOSIS — R1084 Generalized abdominal pain: Secondary | ICD-10-CM

## 2015-11-16 ENCOUNTER — Ambulatory Visit: Payer: BC Managed Care – PPO

## 2015-11-18 ENCOUNTER — Ambulatory Visit
Admission: RE | Admit: 2015-11-18 | Discharge: 2015-11-18 | Disposition: A | Discharge status: Home / Self Care | Payer: BC Managed Care – PPO | Source: Ambulatory Visit | Attending: Bariatrics | Admitting: Bariatrics

## 2015-11-18 ENCOUNTER — Inpatient Hospital Stay: Admission: RE | Admit: 2015-11-18 | Payer: BC Managed Care – PPO | Source: Ambulatory Visit

## 2015-11-18 DIAGNOSIS — R1084 Generalized abdominal pain: Secondary | ICD-10-CM | POA: Diagnosis not present

## 2015-11-18 MED ORDER — IOPAMIDOL (ISOVUE-300) INJECTION 61%
125.0000 mL | Freq: Once | INTRAVENOUS | Status: AC | PRN
  Administered 2015-11-18: 125 mL via INTRAVENOUS

## 2015-11-24 ENCOUNTER — Encounter: Payer: Self-pay | Admitting: *Deleted

## 2015-11-24 NOTE — Patient Instructions (Signed)
  Your procedure is scheduled on: 11-29-15 (TUESDAY) Report to Same Day Surgery 2nd floor medical mall To find out your arrival time please call [phone] between 1PM - 3PM on 11-28-15 [facility])  Remember: Instructions that are not followed completely may result in serious medical risk, up to and including death, or upon the discretion of your surgeon and anesthesiologist your surgery may need to be rescheduled.    _x___ 1. Do not eat food or drink liquids after midnight. No gum chewing or hard candies.     __x__ 2. No Alcohol for 24 hours before or after surgery.   __x__3. No Smoking for 24 prior to surgery.   ____  4. Bring all medications with you on the day of surgery if instructed.    __x__ 5. Notify your doctor if there is any change in your medical condition     (cold, fever, infections).     Do not wear jewelry, make-up, hairpins, clips or nail polish.  Do not wear lotions, powders, or perfumes. You may wear deodorant.  Do not shave 48 hours prior to surgery. Men may shave face and neck.  Do not bring valuables to the hospital.    [facility] is not responsible for any belongings or valuables.               Contacts, dentures or bridgework may not be worn into surgery.  Leave your suitcase in the car. After surgery it may be brought to your room.  For patients admitted to the hospital, discharge time is determined by your treatment team.   Patients discharged the day of surgery will not be allowed to drive home.    Please read over the following fact sheets that you were given:   [facility] Preparing for Surgery and or MRSA Information   _x___ Take these medicines the morning of surgery with A SIP OF WATER:    1. PRILOSEC  2. TAKE AN EXTRA PRILOSEC THE NIGHT BEFORE SURGERY  3. MAY TAKE LORAZEPAM (ATIVAN) IF NEEDED  4.  5.  6.  ____ Fleet Enema (as directed)   _x___ Use CHG Soap or sage wipes as directed on instruction sheet   ____ Use inhalers on the day of  surgery and bring to hospital day of surgery  ____ Stop metformin 2 days prior to surgery    ____ Take 1/2 of usual insulin dose the night before surgery and none on the morning of surgery.   ____ Stop aspirin or coumadin, or plavix  _x__ Stop Anti-inflammatories such as Advil, Aleve, Ibuprofen, Motrin, Naproxen,          Naprosyn, Goodies powders or aspirin products. Ok to take Tylenol OR OXYCODONE   ____ Stop supplements until after surgery.    ____ Bring C-Pap to the hospital.

## 2015-11-25 ENCOUNTER — Encounter
Admission: RE | Admit: 2015-11-25 | Discharge: 2015-11-25 | Disposition: A | Discharge status: Home / Self Care | Payer: BC Managed Care – PPO | Source: Ambulatory Visit | Attending: Bariatrics | Admitting: Bariatrics

## 2015-11-25 DIAGNOSIS — Z01812 Encounter for preprocedural laboratory examination: Secondary | ICD-10-CM | POA: Diagnosis not present

## 2015-11-25 DIAGNOSIS — R1011 Right upper quadrant pain: Secondary | ICD-10-CM | POA: Insufficient documentation

## 2015-11-25 HISTORY — DX: Unspecified osteoarthritis, unspecified site: M19.90

## 2015-11-25 HISTORY — DX: Gastro-esophageal reflux disease without esophagitis: K21.9

## 2015-11-25 HISTORY — DX: Anemia, unspecified: D64.9

## 2015-11-25 LAB — POTASSIUM: Potassium: 4 mmol/L (ref 3.5–5.1)

## 2015-11-29 ENCOUNTER — Encounter: Payer: Self-pay | Admitting: Emergency Medicine

## 2015-11-29 ENCOUNTER — Encounter
Admission: RE | Disposition: A | Discharge status: Home / Self Care | Payer: Self-pay | Source: Ambulatory Visit | Attending: Bariatrics

## 2015-11-29 ENCOUNTER — Ambulatory Visit: Payer: BC Managed Care – PPO | Admitting: Anesthesiology

## 2015-11-29 ENCOUNTER — Ambulatory Visit
Admission: RE | Admit: 2015-11-29 | Discharge: 2015-11-29 | Disposition: A | Discharge status: Home / Self Care | Payer: BC Managed Care – PPO | Source: Ambulatory Visit | Attending: Bariatrics | Admitting: Bariatrics

## 2015-11-29 DIAGNOSIS — K811 Chronic cholecystitis: Secondary | ICD-10-CM | POA: Diagnosis not present

## 2015-11-29 DIAGNOSIS — Z79899 Other long term (current) drug therapy: Secondary | ICD-10-CM | POA: Diagnosis not present

## 2015-11-29 DIAGNOSIS — R1319 Other dysphagia: Secondary | ICD-10-CM | POA: Diagnosis not present

## 2015-11-29 DIAGNOSIS — K828 Other specified diseases of gallbladder: Secondary | ICD-10-CM | POA: Diagnosis not present

## 2015-11-29 DIAGNOSIS — G2581 Restless legs syndrome: Secondary | ICD-10-CM | POA: Diagnosis not present

## 2015-11-29 DIAGNOSIS — E876 Hypokalemia: Secondary | ICD-10-CM | POA: Insufficient documentation

## 2015-11-29 DIAGNOSIS — Z980 Intestinal bypass and anastomosis status: Secondary | ICD-10-CM

## 2015-11-29 DIAGNOSIS — K219 Gastro-esophageal reflux disease without esophagitis: Secondary | ICD-10-CM | POA: Insufficient documentation

## 2015-11-29 HISTORY — PX: CHOLECYSTECTOMY: SHX55

## 2015-11-29 HISTORY — PX: ESOPHAGOGASTRODUODENOSCOPY: SHX5428

## 2015-11-29 HISTORY — PX: LAPAROSCOPIC LYSIS OF ADHESIONS: SHX5905

## 2015-11-29 HISTORY — PX: LAPAROSCOPY: SHX197

## 2015-11-29 LAB — POCT PREGNANCY, URINE: PREG TEST UR: NEGATIVE

## 2015-11-29 SURGERY — LAPAROSCOPIC CHOLECYSTECTOMY
Anesthesia: General

## 2015-11-29 MED ORDER — ACETAMINOPHEN 10 MG/ML IV SOLN
INTRAVENOUS | Status: AC
  Filled 2015-11-29: qty 100

## 2015-11-29 MED ORDER — FENTANYL CITRATE (PF) 100 MCG/2ML IJ SOLN
INTRAMUSCULAR | Status: AC
  Administered 2015-11-29: 50 ug via INTRAVENOUS
  Filled 2015-11-29: qty 2

## 2015-11-29 MED ORDER — LACTATED RINGERS IV SOLN
INTRAVENOUS | Status: DC
  Administered 2015-11-29: 07:00:00 via INTRAVENOUS

## 2015-11-29 MED ORDER — SCOPOLAMINE 1 MG/3DAYS TD PT72
MEDICATED_PATCH | TRANSDERMAL | Status: AC
  Administered 2015-11-29: 1.5 mg via TRANSDERMAL
  Filled 2015-11-29: qty 1

## 2015-11-29 MED ORDER — SCOPOLAMINE 1 MG/3DAYS TD PT72
1.0000 | MEDICATED_PATCH | Freq: Once | TRANSDERMAL | Status: DC
  Administered 2015-11-29: 1.5 mg via TRANSDERMAL

## 2015-11-29 MED ORDER — CEFAZOLIN SODIUM-DEXTROSE 2-4 GM/100ML-% IV SOLN
INTRAVENOUS | Status: AC
  Filled 2015-11-29: qty 100

## 2015-11-29 MED ORDER — FENTANYL CITRATE (PF) 100 MCG/2ML IJ SOLN
INTRAMUSCULAR | Status: DC | PRN
  Administered 2015-11-29: 150 ug via INTRAVENOUS

## 2015-11-29 MED ORDER — FENTANYL CITRATE (PF) 100 MCG/2ML IJ SOLN
INTRAMUSCULAR | Status: AC
  Administered 2015-11-29: 25 ug via INTRAVENOUS
  Filled 2015-11-29: qty 2

## 2015-11-29 MED ORDER — ROCURONIUM BROMIDE 100 MG/10ML IV SOLN
INTRAVENOUS | Status: DC | PRN
  Administered 2015-11-29: 20 mg via INTRAVENOUS
  Administered 2015-11-29: 10 mg via INTRAVENOUS
  Administered 2015-11-29: 30 mg via INTRAVENOUS

## 2015-11-29 MED ORDER — MIDAZOLAM HCL 2 MG/2ML IJ SOLN
INTRAMUSCULAR | Status: DC | PRN
  Administered 2015-11-29: 2 mg via INTRAVENOUS

## 2015-11-29 MED ORDER — PROMETHAZINE HCL 25 MG/ML IJ SOLN: 6.2500 mg | INTRAMUSCULAR | Status: DC | PRN

## 2015-11-29 MED ORDER — ACETAMINOPHEN 10 MG/ML IV SOLN
INTRAVENOUS | Status: DC | PRN
  Administered 2015-11-29: 1000 mg via INTRAVENOUS

## 2015-11-29 MED ORDER — DEXAMETHASONE SODIUM PHOSPHATE 10 MG/ML IJ SOLN
INTRAMUSCULAR | Status: DC | PRN
  Administered 2015-11-29: 10 mg via INTRAVENOUS

## 2015-11-29 MED ORDER — FENTANYL CITRATE (PF) 100 MCG/2ML IJ SOLN
25.0000 ug | INTRAMUSCULAR | Status: DC | PRN
  Administered 2015-11-29 (×3): 25 ug via INTRAVENOUS
  Administered 2015-11-29: 50 ug via INTRAVENOUS
  Administered 2015-11-29: 25 ug via INTRAVENOUS

## 2015-11-29 MED ORDER — CEFAZOLIN SODIUM-DEXTROSE 2-4 GM/100ML-% IV SOLN
2.0000 g | Freq: Once | INTRAVENOUS | Status: AC
  Administered 2015-11-29: 2 g via INTRAVENOUS

## 2015-11-29 MED ORDER — BUPIVACAINE-EPINEPHRINE (PF) 0.25% -1:200000 IJ SOLN
INTRAMUSCULAR | Status: AC
  Filled 2015-11-29: qty 30

## 2015-11-29 MED ORDER — BUPIVACAINE-EPINEPHRINE 0.25% -1:200000 IJ SOLN
INTRAMUSCULAR | Status: DC | PRN
  Administered 2015-11-29: 20 mL

## 2015-11-29 MED ORDER — GLYCOPYRROLATE 0.2 MG/ML IJ SOLN
INTRAMUSCULAR | Status: DC | PRN
  Administered 2015-11-29: 0.6 mg via INTRAVENOUS

## 2015-11-29 MED ORDER — NEOSTIGMINE METHYLSULFATE 10 MG/10ML IV SOLN
INTRAVENOUS | Status: DC | PRN
  Administered 2015-11-29: 4 mg via INTRAVENOUS

## 2015-11-29 MED ORDER — PROPOFOL 10 MG/ML IV BOLUS
INTRAVENOUS | Status: DC | PRN
  Administered 2015-11-29: 150 mg via INTRAVENOUS

## 2015-11-29 SURGICAL SUPPLY — 38 items
APPLIER CLIP ROT 10 11.4 M/L (STAPLE) ×4
BANDAGE ELASTIC 6 LF NS (GAUZE/BANDAGES/DRESSINGS) ×8 IMPLANT
BLADE SURG SZ11 CARB STEEL (BLADE) ×4 IMPLANT
CANISTER SUCT 1200ML W/VALVE (MISCELLANEOUS) ×4 IMPLANT
CHLORAPREP W/TINT 26ML (MISCELLANEOUS) ×4 IMPLANT
CLIP APPLIE ROT 10 11.4 M/L (STAPLE) ×2 IMPLANT
DEFOGGER SCOPE WARMER CLEARIFY (MISCELLANEOUS) ×4 IMPLANT
DRAPE UTILITY 15X26 TOWEL STRL (DRAPES) ×8 IMPLANT
ENDOLOOP SUT PDS II  0 18 (SUTURE) ×2
ENDOLOOP SUT PDS II 0 18 (SUTURE) ×2 IMPLANT
FILTER LAP SMOKE EVAC STRL (MISCELLANEOUS) ×4 IMPLANT
GLOVE BIO SURGEON STRL SZ7 (GLOVE) ×8 IMPLANT
GLOVE BIOGEL PI IND STRL 8.5 (GLOVE) ×2 IMPLANT
GLOVE BIOGEL PI INDICATOR 8.5 (GLOVE) ×2
GLOVE SURG SYN 8.0 (GLOVE) ×4 IMPLANT
GOWN STRL REUS W/ TWL LRG LVL3 (GOWN DISPOSABLE) ×6 IMPLANT
GOWN STRL REUS W/TWL LRG LVL3 (GOWN DISPOSABLE) ×6
GRASPER SUT TROCAR 14GX15 (MISCELLANEOUS) ×4 IMPLANT
IRRIGATION STRYKERFLOW (MISCELLANEOUS) ×2 IMPLANT
IRRIGATOR STRYKERFLOW (MISCELLANEOUS) ×4
IV NS 1000ML (IV SOLUTION) ×2
IV NS 1000ML BAXH (IV SOLUTION) ×2 IMPLANT
KIT RM TURNOVER STRD PROC AR (KITS) ×4 IMPLANT
LABEL OR SOLS (LABEL) IMPLANT
LIQUID BAND (GAUZE/BANDAGES/DRESSINGS) ×4 IMPLANT
NDL SAFETY 22GX1.5 (NEEDLE) ×4 IMPLANT
NEEDLE VERESS 14GA 120MM (NEEDLE) ×4 IMPLANT
NS IRRIG 500ML POUR BTL (IV SOLUTION) ×4 IMPLANT
PACK LAP CHOLECYSTECTOMY (MISCELLANEOUS) ×4 IMPLANT
SHEARS HARMONIC ACE PLUS 45CM (MISCELLANEOUS) ×4 IMPLANT
SLEEVE ENDOPATH XCEL 5M (ENDOMECHANICALS) ×12 IMPLANT
SUT MNCRL AB 4-0 PS2 18 (SUTURE) ×4 IMPLANT
SUT VIC AB 0 CT2 27 (SUTURE) ×4 IMPLANT
SYR 20CC LL (SYRINGE) ×4 IMPLANT
TROCAR XCEL 12X100 BLDLESS (ENDOMECHANICALS) ×4 IMPLANT
TROCAR XCEL NON-BLD 11X100MML (ENDOMECHANICALS) ×4 IMPLANT
TROCAR XCEL NON-BLD 5MMX100MML (ENDOMECHANICALS) ×4 IMPLANT
TUBING INSUFFLATOR HEATED (MISCELLANEOUS) ×4 IMPLANT

## 2015-11-29 NOTE — Discharge Instructions (Signed)

## 2015-11-29 NOTE — Anesthesia Procedure Notes (Signed)
Procedure Name: Intubation Date/Time: 11/29/2015 7:31 AM Performed by: Jonna Clark Pre-anesthesia Checklist: Patient identified, Patient being monitored, Timeout performed, Emergency Drugs available and Suction available Patient Re-evaluated:Patient Re-evaluated prior to inductionOxygen Delivery Method: Circle system utilized Preoxygenation: Pre-oxygenation with 100% oxygen Intubation Type: IV induction Ventilation: Mask ventilation without difficulty Laryngoscope Size: Mac and 3 Grade View: Grade I Tube type: Oral Tube size: 7.0 mm Number of attempts: 1 Placement Confirmation: ETT inserted through vocal cords under direct vision,  positive ETCO2 and breath sounds checked- equal and bilateral Secured at: 21 cm Tube secured with: Tape Dental Injury: Teeth and Oropharynx as per pre-operative assessment

## 2015-11-29 NOTE — Anesthesia Postprocedure Evaluation (Signed)
Anesthesia Post Note  Patient: LATITIA CIMINI  Procedure(s) Performed: Procedure(s) (LRB): LAPAROSCOPIC CHOLECYSTECTOMY (N/A) ESOPHAGOGASTRODUODENOSCOPY (EGD) (N/A) LAPAROSCOPIC LYSIS OF ADHESIONS LAPAROSCOPY DIAGNOSTIC  Patient location during evaluation: PACU Anesthesia Type: General Level of consciousness: awake and alert Pain management: pain level controlled Vital Signs Assessment: post-procedure vital signs reviewed and stable Respiratory status: spontaneous breathing, nonlabored ventilation, respiratory function stable and patient connected to nasal cannula oxygen Cardiovascular status: blood pressure returned to baseline and stable Postop Assessment: no signs of nausea or vomiting Anesthetic complications: no    Last Vitals:  Vitals:   11/29/15 1024 11/29/15 1107  BP: 124/66 138/76  Pulse: 65 83  Resp: 16 16  Temp: (!) 35.8 C     Last Pain:  Vitals:   11/29/15 1024  TempSrc: Tympanic  PainSc:                  Martha Clan

## 2015-11-29 NOTE — Op Note (Signed)
PATIENT: Brittney Diaz 1965-06-27  PROCEDURE PERFORMED: Procedure(s): LAPAROSCOPIC CHOLECYSTECTOMY (N/A) ESOPHAGOGASTRODUODENOSCOPY (EGD) (N/A) LAPAROSCOPIC LYSIS OF ADHESIONS LAPAROSCOPY DIAGNOSTIC PRE-OP DIAGNOSIS: right upper quadrant pain, rule out ulcer POST-OP DIAGNOSIS: right upper quadrant pain ESTIMATED BLOOD LOSS: Minimal SURGEON: Ladora Daniel  ASSISTANT: Liliane Bade PA  PROCEDURE NOTE: Patient was brought to the operating room placed in supine position. General anesthesia obtained with orotracheal intubation. The abdomen and chest were sterilely prepped and draped. A varies needle introduced through a small incision in the superior fold of the umbilicus as confirmed by saline drop test. Pneumoperitoneum obtained with carbon dioxide. A 5 mm Optiview trocar then introduced into the abdominal cavity. 3 additional 5 mm trochars introduced across the upper abdomen. Pericholecystic and perihepatic omental adhesions taken down by use of the Harmonic scalpel. Omentum adherent to portions of the falciform ligament also divided by use of the Harmonic scalpel to improve exposure of the base of gallbladder. The gallbladder was elevated in the thickened peritoneum at the base of gallbladder scored by use of the Harmonic scalpel and a combination of blunt dissection and use of the Harmonic scalpel used to isolate the cystic duct and accompanying cystic artery. A retrograde dissection of gallbladder was then performed freeing the gallbladder from the liver bed resulting in excellent hemostasis. Cystic artery then divided at several of its branch points along the base of the gallbladder. The cystic duct was then isolated proximally over distance of approximately 1and1/2 cm. The cystic duct then ligated with a 0 PDS Endoloop. The cystic duct was then divided distal to the suture.. The gallbladder was then decompressed by aspiration and delivered through the epigastric 5 mm trocar site after  dilatation the fascia by way of a hemostat. Repeat inspection of the gallbladder fossa confirmed adequate hemostasis. The camera redirected and the Roux limb was identified and followed from the gastrojejunal anastomosis to the area of the jejunal jejunal anastomosis. There was no evidence of obstruction or abnormal portion in the area of either anastomosis. There was no evidence of mesenteric defect either in the area of Peterson's defect or near the jejunal jejunal anastomosis. The bowel distal to the jejunal jejunal anastomosis grossly unremarkable. At this time no additional therapy within the abdomen is felt to be appropriate The pneumoperitoneum relieved and the trochars removed. Wounds injected with quarter percent Marcaine with epinephrine and then closed with 4-0 Monocryl in the dermis followed by Dermabond. Intraoperative endoscopy performed at this point. Scope was easily directed into the esophagus and from there to the gastric bypass and into the alimentary limb. There is no evidence of anastomotic ulcer appreciated. No evidence of bile reflux noted. The scope delivered through the distal aspect the gastric pouch and the anastomosis noted to measure approximately 0.5-1.6 cm. Was mild tapering of the distal aspect the gastric pouch leading into the area of anastomosis. No evidence of gastric ulcerations appreciated. Was no evidence of hiatal hernia noted. The distal esophagus was noted be unremarkable with evidence of an intact lower esophageal sphincter and preserved peristalsis in the mid and distal esophagus are appreciated. The endoscope was withdrawn. At this time Patient allowed recover this point having tolerated the procedure well.

## 2015-11-29 NOTE — Anesthesia Preprocedure Evaluation (Signed)
Anesthesia Evaluation  Patient identified by MRN, date of birth, ID band Patient awake    Reviewed: Allergy & Precautions, H&P , NPO status , Patient's Chart, lab work & pertinent test results, reviewed documented beta blocker date and time   History of Anesthesia Complications Negative for: history of anesthetic complications  Airway Mallampati: II  TM Distance: >3 FB Neck ROM: full    Dental no notable dental hx. (+) Teeth Intact   Pulmonary neg pulmonary ROS,    Pulmonary exam normal breath sounds clear to auscultation       Cardiovascular Exercise Tolerance: Good negative cardio ROS Normal cardiovascular exam Rhythm:regular Rate:Normal     Neuro/Psych PSYCHIATRIC DISORDERS (Depression) negative neurological ROS     GI/Hepatic Neg liver ROS, GERD  ,  Endo/Other  negative endocrine ROS  Renal/GU negative Renal ROS  negative genitourinary   Musculoskeletal   Abdominal   Peds  Hematology  (+) Blood dyscrasia, anemia ,   Anesthesia Other Findings Past Medical History: No date: Anemia     Comment: H/O No date: Arthritis     Comment: LEFT KNEE No date: Depression 2013: DVT (deep venous thrombosis) (HCC)     Comment: while on BCP/ LEG X1-DUE TO HAVING NUVA RING No date: GERD (gastroesophageal reflux disease) No date: Headache(784.0)     Comment: migraines/ RARELY NOW No date: Hip pain, acute     Comment: poss from long term use of Prednisone 2013: Pulmonary embolism (HCC)     Comment: from Nuva Ring No date: TMJ syndrome     Comment: resolved after surgery   Reproductive/Obstetrics negative OB ROS                             Anesthesia Physical Anesthesia Plan  ASA: II  Anesthesia Plan: General ETT   Post-op Pain Management:    Induction:   Airway Management Planned:   Additional Equipment:   Intra-op Plan:   Post-operative Plan:   Informed Consent: I have  reviewed the patients History and Physical, chart, labs and discussed the procedure including the risks, benefits and alternatives for the proposed anesthesia with the patient or authorized representative who has indicated his/her understanding and acceptance.   Dental Advisory Given  Plan Discussed with: Anesthesiologist, CRNA and Surgeon  Anesthesia Plan Comments:         Anesthesia Quick Evaluation

## 2015-11-29 NOTE — Transfer of Care (Signed)
Immediate Anesthesia Transfer of Care Note  Patient: Brittney Diaz  Procedure(s) Performed: Procedure(s): LAPAROSCOPIC CHOLECYSTECTOMY (N/A) ESOPHAGOGASTRODUODENOSCOPY (EGD) (N/A) LAPAROSCOPIC LYSIS OF ADHESIONS LAPAROSCOPY DIAGNOSTIC  Patient Location: PACU  Anesthesia Type:General  Level of Consciousness: patient cooperative and lethargic  Airway & Oxygen Therapy: Patient Spontanous Breathing and Patient connected to face mask oxygen  Post-op Assessment: Report given to RN and Post -op Vital signs reviewed and stable  Post vital signs: Reviewed and stable  Last Vitals:  Vitals:   11/29/15 0614 11/29/15 0905  BP: 131/84 119/71  Pulse: 79 66  Resp: 18 20  Temp: 36.8 C 36.5 C    Last Pain:  Vitals:   11/29/15 0614  TempSrc: Tympanic  PainSc: 4          Complications: No apparent anesthesia complications

## 2015-11-29 NOTE — H&P (Signed)
History and physical on paper chart with no changes noted. Areas of pain prompting surgery noted. Will perform upper endoscopy at time of surgery to r/o ulcer.

## 2015-11-29 NOTE — Brief Op Note (Cosign Needed)
11/29/2015  10:00 AM  PATIENT:  Brittney Diaz  50 y.o. female  PRE-OPERATIVE DIAGNOSIS:  right upper quadrant pain, rule out ulcer  POST-OPERATIVE DIAGNOSIS:  right upper quadrant pain  PROCEDURE:  Procedure(s): LAPAROSCOPIC CHOLECYSTECTOMY (N/A) ESOPHAGOGASTRODUODENOSCOPY (EGD) (N/A) LAPAROSCOPIC LYSIS OF ADHESIONS LAPAROSCOPY DIAGNOSTIC  SURGEON:  Surgeon(s) and Role:    * Ladora Daniel, MD - Primary  PHYSICIAN ASSISTANT:   ASSISTANTS: Merriel Zinger, PA-C   ANESTHESIA:   general  EBL:  Total I/O In: 600 [I.V.:600] Out: -   BLOOD ADMINISTERED:none  DRAINS: none   LOCAL MEDICATIONS USED:  BUPIVICAINE   SPECIMEN:  Source of Specimen:  Gallbladder  DISPOSITION OF SPECIMEN:  PATHOLOGY  COUNTS:  YES  TOURNIQUET:  * No tourniquets in log *  DICTATION: .Dragon Dictation  PLAN OF CARE: Discharge to home after PACU  PATIENT DISPOSITION:  PACU - hemodynamically stable.   Delay start of Pharmacological VTE agent (>24hrs) due to surgical blood loss or risk of bleeding: no

## 2015-11-30 LAB — SURGICAL PATHOLOGY

## 2016-05-16 ENCOUNTER — Other Ambulatory Visit: Payer: Self-pay | Admitting: Internal Medicine

## 2016-05-16 DIAGNOSIS — R221 Localized swelling, mass and lump, neck: Secondary | ICD-10-CM

## 2016-05-23 ENCOUNTER — Ambulatory Visit: Payer: BC Managed Care – PPO

## 2017-08-13 ENCOUNTER — Telehealth: Payer: Self-pay | Admitting: Gastroenterology

## 2017-08-13 NOTE — Telephone Encounter (Signed)
Patient called and has seen [place] in the past 2016. She is having severe chest pain, Mid esophagus and feels like food is getting stuck. She takes Carafate 44ml and Prilosec 20 mg.  She doesn't want to suffer until 08/27/17.

## 2017-08-15 ENCOUNTER — Encounter: Payer: Self-pay | Admitting: Gastroenterology

## 2017-08-15 ENCOUNTER — Ambulatory Visit (INDEPENDENT_AMBULATORY_CARE_PROVIDER_SITE_OTHER): Payer: BC Managed Care – PPO | Admitting: Gastroenterology

## 2017-08-15 VITALS — BP 104/72 | HR 82 | Ht 67.0 in | Wt 210.4 lb

## 2017-08-15 DIAGNOSIS — R1310 Dysphagia, unspecified: Secondary | ICD-10-CM | POA: Diagnosis not present

## 2017-08-15 DIAGNOSIS — Z1211 Encounter for screening for malignant neoplasm of colon: Secondary | ICD-10-CM

## 2017-08-15 MED ORDER — PEG 3350-KCL-NA BICARB-NACL 420 G PO SOLR: 4000.0000 mL | Freq: Once | ORAL | 0 refills | Status: DC

## 2017-08-15 MED ORDER — PEG 3350-KCL-NA BICARB-NACL 420 G PO SOLR: 4000.0000 mL | Freq: Once | ORAL | 0 refills | Status: AC

## 2017-08-15 NOTE — Progress Notes (Signed)
Brittney Diaz [address]  [place]  [place], [place] [address]  Main: [phone]  Fax: [phone]   Gastroenterology Consultation  Referring Provider:     Adin Hector, MD Primary Care Physician:  Adin Hector, MD Primary Gastroenterologist:  Dr. Vonda Diaz Reason for Consultation:     Dysphagia, midepigastric discomfort        HPI:   Chief complaint: Dysphagia, midepigastric discomfort  Brittney Diaz is a 52 y.o. y/o female referred for consultation & management  by Dr. Caryl Comes, Wendelyn Breslow III, MD.  Patient with history of Roux-en-Y gastric bypass, and previous EGDs by Dr. Allen Norris with dilation of gastrojejunal anastomosis, now with 36-month history of intermittent dysphagia, and midepigastric discomfort.  Patient states after her previous two dilations, she did have relief in symptoms.  No weight loss, no nausea or vomiting.  No altered bowel habits.  No blood in stool.  Dysphagia occurs 1-2 times a week, and she has to bring food back up.  Midepigastric discomfort is dull, 3/10 pain, with no radiation, with some relief with sucralfate.  No heartburn.   Past Medical History:  Diagnosis Date  . Anemia    H/O  . Arthritis    LEFT KNEE  . Depression   . DVT (deep venous thrombosis) ([facility]) 2013   while on BCP/ LEG X1-DUE TO HAVING NUVA RING  . GERD (gastroesophageal reflux disease)   . Headache(784.0)    migraines/ RARELY NOW  . Hip pain, acute    poss from long term use of Prednisone  . Pulmonary embolism ([facility]) 2013   from [employer]  . 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 CHOLECYSTECTOMY N/A 11/29/2015   Procedure: LAPAROSCOPIC CHOLECYSTECTOMY;  Surgeon: Ladora Daniel, MD;  Location: [facility];  Service: General;  Laterality: N/A;  . DILITATION & CURRETTAGE/HYSTROSCOPY WITH NOVASURE ABLATION N/A 07/30/2013   Procedure: DILATATION & CURETTAGE/HYSTEROSCOPY WITH  THERMACHOICE, AND  POLYPECTOMY;  Surgeon: Lovenia Kim, MD;  Location: [facility];  Service: Gynecology;  Laterality: N/A;  . ESOPHAGOGASTRODUODENOSCOPY N/A 11/29/2015   Procedure: ESOPHAGOGASTRODUODENOSCOPY (EGD);  Surgeon: Ladora Daniel, MD;  Location: [facility];  Service: General;  Laterality: N/A;  . ESOPHAGOGASTRODUODENOSCOPY (EGD) WITH PROPOFOL N/A 03/03/2015   Procedure: ESOPHAGOGASTRODUODENOSCOPY (EGD) WITH PROPOFOL with balloon dilation;  Surgeon: Lucilla Lame, MD;  Location: [place] [facility];  Service: Endoscopy;  Laterality: N/A;  . FEMUR FRACTURE SURGERY Right 1989  . GASTRIC BYPASS     12/14  . HYSTEROSCOPY W/ ENDOMETRIAL ABLATION    . JOINT REPLACEMENT Bilateral    HIPS  . LAPAROSCOPIC LYSIS OF ADHESIONS  11/29/2015   Procedure: LAPAROSCOPIC LYSIS OF ADHESIONS;  Surgeon: Ladora Daniel, MD;  Location: [facility];  Service: General;;  . LAPAROSCOPY  11/29/2015   Procedure: LAPAROSCOPY DIAGNOSTIC;  Surgeon: Ladora Daniel, MD;  Location: [facility];  Service: General;;  . NASAL SINUS SURGERY    . TOTAL HIP ARTHROPLASTY Bilateral 2015    Prior to Admission medications   Medication Sig Start Date End Date Taking? Authorizing Provider  desvenlafaxine (PRISTIQ) 100 MG 24 hr tablet Take 100 mg by mouth at bedtime.    [provider]  gabapentin (NEURONTIN) 300 MG capsule Take 300 mg by mouth 2 (two) times daily.    [provider]  LORazepam (ATIVAN) 0.5 MG tablet Take 0.5 mg by mouth as needed.  02/02/15  [provider]  metolazone (ZAROXOLYN) 2.5 MG tablet Take 2.5 mg by mouth every other day.    [provider]  Multiple Vitamin (MULTIVITAMIN) capsule Take 1 capsule by mouth daily.    [provider]  omeprazole (PRILOSEC) 20 MG capsule Take 20 mg by mouth every morning. EVE    [provider]  oxycodone (OXY-IR) 5 MG capsule Take 5 mg by mouth every 4 (four) hours as needed.     [provider]  potassium chloride (K-DUR) 10 MEQ  tablet Take 10 mEq by mouth every other day. TAKES ON THE DAYS SHE TAKES TORSEMIDE AND METOLAZONE    [provider]  Rotigotine (NEUPRO) 8 MG/24HR PT24 Place 1 patch onto the skin daily.     [provider]  sucralfate (CARAFATE) 1 GM/10ML suspension Take 1 g by mouth as needed.     [provider]  torsemide (DEMADEX) 20 MG tablet Take 20 mg by mouth every other day.     [provider]  traZODone (DESYREL) 100 MG tablet Take 50 mg by mouth at bedtime as needed for sleep.    [provider]    Family History  Problem Relation Age of Onset  . Pulmonary fibrosis Father   . Breast cancer Maternal Grandmother   . Heart disease Paternal Grandfather      Social History   Tobacco Use  . Smoking status: Never Smoker  . Smokeless tobacco: Never Used  Substance Use Topics  . Alcohol use: Yes    Comment: OCC  . Drug use: No    Allergies as of 08/15/2017  . (No Known Allergies)    Review of Systems:    All systems reviewed and negative except where noted in HPI.   Physical Exam:  There were no vitals taken for this visit. No LMP recorded. Patient has had an ablation. Psych:  Alert and cooperative. Normal mood and affect. General:   Alert,  Well-developed, well-nourished, pleasant and cooperative in NAD Head:  Normocephalic and atraumatic. Eyes:  Sclera clear, no icterus.   Conjunctiva pink. Ears:  Normal auditory acuity. Nose:  No deformity, discharge, or lesions. Mouth:  No deformity or lesions,oropharynx pink & moist. Neck:  Supple; no masses or thyromegaly. Lungs:  Respirations even and unlabored.  Clear throughout to auscultation.   No wheezes, crackles, or rhonchi. No acute distress. Heart:  Regular rate and rhythm; no murmurs, clicks, rubs, or gallops. Abdomen:  Normal bowel sounds.  No bruits.  Soft, non-tender and non-distended without masses, hepatosplenomegaly or hernias noted.  No guarding or rebound tenderness.    Msk:   Symmetrical without gross deformities. Good, equal movement & strength bilaterally. Pulses:  Normal pulses noted. Extremities:  No clubbing or edema.  No cyanosis. Neurologic:  Alert and oriented x3;  grossly normal neurologically. Skin:  Intact without significant lesions or rashes. No jaundice. Lymph Nodes:  No significant cervical adenopathy. Psych:  Alert and cooperative. Normal mood and affect.   Labs: CBC    Component Value Date/Time   WBC 10.2 01/19/2015 1718   RBC 4.24 01/19/2015 1718   HGB 13.5 01/19/2015 1718   HGB 11.1 (L) 02/26/2014 1042   HCT 41.5 01/19/2015 1718   HCT 35.3 02/26/2014 1042   PLT 355 01/19/2015 1718   PLT 443 (H) 02/26/2014 1042   MCV 97.9 01/19/2015 1718   MCV 90 02/26/2014 1042   MCH 31.9 01/19/2015 1718   MCHC 32.6 01/19/2015 1718   RDW 13.4 01/19/2015  1718   RDW 14.8 (H) 02/26/2014 1042   LYMPHSABS 3.0 02/26/2014 1042   MONOABS 0.5 02/26/2014 1042   EOSABS 0.3 02/26/2014 1042   BASOSABS 0.1 02/26/2014 1042   CMP     Component Value Date/Time   NA 138 01/19/2015 1718   NA 141 02/26/2014 1042   K 4.0 11/25/2015 0928   K 4.2 02/26/2014 1042   CL 95 (L) 01/19/2015 1718   CL 105 02/26/2014 1042   CO2 30 01/19/2015 1718   CO2 31 02/26/2014 1042   GLUCOSE 98 01/19/2015 1718   GLUCOSE 82 02/26/2014 1042   BUN 21 (H) 01/19/2015 1718   BUN 13 02/26/2014 1042   CREATININE 0.91 01/19/2015 1718   CREATININE 0.99 02/26/2014 1042   CALCIUM 8.8 (L) 01/19/2015 1718   CALCIUM 8.5 02/26/2014 1042   PROT 6.9 02/26/2014 1042   ALBUMIN 4.0 01/19/2015 1718   ALBUMIN 3.1 (L) 02/26/2014 1042   AST 21 02/26/2014 1042   ALT 16 02/26/2014 1042   ALKPHOS 117 (H) 02/26/2014 1042   BILITOT 0.3 02/26/2014 1042   GFRNONAA >60 01/19/2015 1718   GFRNONAA >60 02/26/2014 1042   GFRNONAA 54 (L) 12/12/2012 1054   GFRAA >60 01/19/2015 1718   GFRAA >60 02/26/2014 1042   GFRAA >60 12/12/2012 1054    Imaging Studies: No results found.  Assessment and Plan:     ZAKIA SAINATO is a 52 y.o. y/o female has been referred for dysphagia, and midepigastric discomfort with history of Roux-en-Y gastric bypass, and previous EGDs requiring dilation of gastrojejunal anastomosis  Patient's new dysphagia requires evaluation for esophageal rings, narrowing, strictures, EOE We will obtain biopsies during the procedure if possible Will evaluate gastrojejunal anastomosis, and if it is narrowed, will attempt dilation, and also we will send her back to her bariatric surgeon for reevaluation, as the anastomosis site should not require multiple dilations this far out from her surgery.   Proper PPI use, to be 30 minutes before meals was discussed with patient and she verbalized understanding  We will also evaluate H. pylori serology  American Cancer Society recommend screening colonoscopy after 50 years of age.  This was discussed with the patient, and the option of scheduling screening colonoscopy along with her EGD, versus waiting till she is 89, which is recommended by other GI societies, were discussed.  Patient would like to go ahead and schedule her screening colonoscopy at this time.  I have discussed alternative options, risks & benefits,  which include, but are not limited to, bleeding, infection, perforation,respiratory complication & drug reaction.  The patient agrees with this plan & written consent will be obtained.     Dr Brittney Diaz

## 2017-08-23 ENCOUNTER — Other Ambulatory Visit
Admission: RE | Admit: 2017-08-23 | Discharge: 2017-08-23 | Disposition: A | Discharge status: Home / Self Care | Payer: BC Managed Care – PPO | Source: Ambulatory Visit | Attending: Gastroenterology | Admitting: Gastroenterology

## 2017-08-23 ENCOUNTER — Encounter: Payer: Self-pay | Admitting: Emergency Medicine

## 2017-08-23 DIAGNOSIS — R1310 Dysphagia, unspecified: Secondary | ICD-10-CM | POA: Insufficient documentation

## 2017-08-26 ENCOUNTER — Ambulatory Visit
Admission: RE | Admit: 2017-08-26 | Discharge: 2017-08-26 | Disposition: A | Discharge status: Home / Self Care | Payer: BC Managed Care – PPO | Source: Ambulatory Visit | Attending: Gastroenterology | Admitting: Gastroenterology

## 2017-08-26 ENCOUNTER — Ambulatory Visit: Payer: BC Managed Care – PPO | Admitting: Anesthesiology

## 2017-08-26 ENCOUNTER — Encounter: Payer: Self-pay | Admitting: *Deleted

## 2017-08-26 ENCOUNTER — Encounter
Admission: RE | Disposition: A | Discharge status: Home / Self Care | Payer: Self-pay | Source: Ambulatory Visit | Attending: Gastroenterology

## 2017-08-26 DIAGNOSIS — D123 Benign neoplasm of transverse colon: Secondary | ICD-10-CM | POA: Insufficient documentation

## 2017-08-26 DIAGNOSIS — F329 Major depressive disorder, single episode, unspecified: Secondary | ICD-10-CM | POA: Diagnosis not present

## 2017-08-26 DIAGNOSIS — Z980 Intestinal bypass and anastomosis status: Secondary | ICD-10-CM

## 2017-08-26 DIAGNOSIS — Z79899 Other long term (current) drug therapy: Secondary | ICD-10-CM | POA: Diagnosis not present

## 2017-08-26 DIAGNOSIS — Z86711 Personal history of pulmonary embolism: Secondary | ICD-10-CM | POA: Diagnosis not present

## 2017-08-26 DIAGNOSIS — K219 Gastro-esophageal reflux disease without esophagitis: Secondary | ICD-10-CM | POA: Diagnosis not present

## 2017-08-26 DIAGNOSIS — Z96643 Presence of artificial hip joint, bilateral: Secondary | ICD-10-CM | POA: Diagnosis not present

## 2017-08-26 DIAGNOSIS — Z9884 Bariatric surgery status: Secondary | ICD-10-CM | POA: Insufficient documentation

## 2017-08-26 DIAGNOSIS — K6289 Other specified diseases of anus and rectum: Secondary | ICD-10-CM

## 2017-08-26 DIAGNOSIS — Z1211 Encounter for screening for malignant neoplasm of colon: Secondary | ICD-10-CM | POA: Diagnosis not present

## 2017-08-26 DIAGNOSIS — R1310 Dysphagia, unspecified: Secondary | ICD-10-CM | POA: Insufficient documentation

## 2017-08-26 HISTORY — PX: COLONOSCOPY WITH PROPOFOL: SHX5780

## 2017-08-26 HISTORY — PX: ESOPHAGOGASTRODUODENOSCOPY (EGD) WITH PROPOFOL: SHX5813

## 2017-08-26 LAB — H PYLORI, IGM, IGG, IGA AB: H. Pylogi, Igm Abs: <9 units (ref 0.0–8.9)

## 2017-08-26 SURGERY — COLONOSCOPY WITH PROPOFOL
Anesthesia: General

## 2017-08-26 MED ORDER — PROPOFOL 500 MG/50ML IV EMUL
INTRAVENOUS | Status: DC | PRN
  Administered 2017-08-26: 200 ug/kg/min via INTRAVENOUS

## 2017-08-26 MED ORDER — PROPOFOL 10 MG/ML IV BOLUS
INTRAVENOUS | Status: DC | PRN
  Administered 2017-08-26: 80 mg via INTRAVENOUS
  Administered 2017-08-26: 20 mg via INTRAVENOUS

## 2017-08-26 MED ORDER — PROPOFOL 500 MG/50ML IV EMUL
INTRAVENOUS | Status: AC
  Filled 2017-08-26: qty 50

## 2017-08-26 MED ORDER — LIDOCAINE HCL (PF) 2 % IJ SOLN
INTRAMUSCULAR | Status: DC | PRN
  Administered 2017-08-26: 100 mg via INTRADERMAL

## 2017-08-26 MED ORDER — SODIUM CHLORIDE 0.9 % IV SOLN
INTRAVENOUS | Status: DC
  Administered 2017-08-26: 1000 mL via INTRAVENOUS

## 2017-08-26 MED ORDER — PHENYLEPHRINE HCL 10 MG/ML IJ SOLN
INTRAMUSCULAR | Status: DC | PRN
  Administered 2017-08-26 (×2): 100 ug via INTRAVENOUS

## 2017-08-26 NOTE — Anesthesia Postprocedure Evaluation (Signed)
Anesthesia Post Note  Patient: Brittney Diaz  Procedure(s) Performed: COLONOSCOPY WITH PROPOFOL (N/A ) ESOPHAGOGASTRODUODENOSCOPY (EGD) WITH PROPOFOL (N/A )  Patient location during evaluation: Endoscopy Anesthesia Type: General Level of consciousness: awake and alert Pain management: pain level controlled Vital Signs Assessment: post-procedure vital signs reviewed and stable Respiratory status: spontaneous breathing, nonlabored ventilation, respiratory function stable and patient connected to nasal cannula oxygen Cardiovascular status: blood pressure returned to baseline and stable Postop Assessment: no apparent nausea or vomiting Anesthetic complications: no     Last Vitals:  Vitals:   08/26/17 1602 08/26/17 1610  BP: 98/66 100/60  Pulse: 62 62  Resp: 13 14  Temp:    SpO2: 100%     Last Pain:  Vitals:   08/26/17 1610  TempSrc:   PainSc: 0-No pain                 Martha Clan

## 2017-08-26 NOTE — Op Note (Signed)
[facility] Gastroenterology Patient Name: Brittney Diaz Procedure Date: 08/26/2017 2:34 PM MRN: [id number] Account #: [id number] Date of Birth: [birth year] Admit Type: Outpatient Age: 52 Room: [facility] ENDO ROOM 4 Gender: Female Note Status: Finalized Procedure:            Upper GI endoscopy Indications:          Dysphagia, Status post Roux-en-Y, History of EGD with                        dilation of gastrojejunal anastomosis to 58mm by Dr.                        Allen Norris in 2016 Providers:            Margretta Sidle B. Bonna Gains MD, MD Referring MD:         Ramonita Lab, MD (Referring MD) Medicines:            Monitored Anesthesia Care Complications:        No immediate complications. Procedure:            Pre-Anesthesia Assessment:                       - The risks and benefits of the procedure and the                        sedation options and risks were discussed with the                        patient. All questions were answered and informed                        consent was obtained.                       - Patient identification and proposed procedure were                        verified prior to the procedure.                       - ASA Grade Assessment: II - A patient with mild                        systemic disease.                       After obtaining informed consent, the endoscope was                        passed under direct vision. Throughout the procedure,                        the patient's blood pressure, pulse, and oxygen                        saturations were monitored continuously. The Endoscope                        was introduced through the mouth, and advanced to the  jejunum. The upper GI endoscopy was accomplished with                        ease. The patient tolerated the procedure well. Findings:      The Z-line was regular. Biopsies were obtained from the proximal and       distal esophagus with cold forceps for  histology of suspected       eosinophilic esophagitis.      The entire examined stomach was normal.      The examined jejunum was normal.      The anastomosis was normal. A TTS dilator was passed through the scope.       Dilation with a 15-16.5-18 mm anastomotic balloon dilator was performed       with maximum dilation to 16.40mm performed. The dilation site was       examined and showed no perforation or tears. Good appropriate heme       effect noted at the site after dilation to 16.5 mm. Impression:           - Z-line regular. Esophagus Biopsied to rule out EoE.                       - Normal stomach.                       - Normal examined jejunum. Normal RYGB anatomy noted                       - Normal anastomosis. Dilated to 16.5 mm due to                        patient's symptomatic improvement after her last EGD in                        2016. Recommendation:       - Await pathology results.                       - Continue present medications.                       - Return to my office as previously scheduled.                       - Return to primary care physician as previously                        scheduled.                       - The findings and recommendations were discussed with                        the patient.                       - The findings and recommendations were discussed with                        the designated responsible adult. Procedure Code(s):    --- Professional ---  81856, Esophagogastroduodenoscopy, flexible, transoral;                        with dilation of gastric/duodenal stricture(s) (eg,                        balloon, bougie)                       43239, Esophagogastroduodenoscopy, flexible, transoral;                        with biopsy, single or multiple Diagnosis Code(s):    --- Professional ---                       R13.10, Dysphagia, unspecified                       Z98.0, Intestinal bypass and anastomosis  status CPT copyright 2017 American Medical Association. All rights reserved. The codes documented in this report are preliminary and upon coder review may  be revised to meet current compliance requirements.  [name], MD Margretta Sidle B. Bonna Gains MD, MD 08/26/2017 3:03:17 PM This report has been signed electronically. Number of Addenda: 0 Note Initiated On: 08/26/2017 2:34 PM Estimated Blood Loss: Estimated blood loss: none.      [facility]

## 2017-08-26 NOTE — Anesthesia Post-op Follow-up Note (Signed)
Anesthesia QCDR form completed.        

## 2017-08-26 NOTE — Transfer of Care (Signed)
Immediate Anesthesia Transfer of Care Note  Patient: Brittney Diaz  Procedure(s) Performed: COLONOSCOPY WITH PROPOFOL (N/A ) ESOPHAGOGASTRODUODENOSCOPY (EGD) WITH PROPOFOL (N/A )  Patient Location: PACU  Anesthesia Type:General  Level of Consciousness: sedated  Airway & Oxygen Therapy: Patient Spontanous Breathing and Patient connected to nasal cannula oxygen  Post-op Assessment: Report given to RN and Post -op Vital signs reviewed and stable  Post vital signs: Reviewed and stable  Last Vitals:  Vitals Value Taken Time  BP 117/80 08/26/2017  3:41 PM  Temp 35.6 C 08/26/2017  3:40 PM  Pulse 61 08/26/2017  3:40 PM  Resp 13 08/26/2017  3:41 PM  SpO2 100 % 08/26/2017  3:40 PM  Vitals shown include unvalidated device data.  Last Pain:  Vitals:   08/26/17 1540  TempSrc: Tympanic  PainSc:       Patients Stated Pain Goal: 0 (04/54/09 8119)  Complications: No apparent anesthesia complications

## 2017-08-26 NOTE — Op Note (Signed)
[facility] Patient Name: Brittney Diaz Procedure Date: 08/26/2017 2:33 PM MRN: [id number] Account #: [id number] Date of Birth: 1966-01-27 Admit Type: Outpatient Age: 52 Room: [facility] ROOM 4 Gender: Female Note Status: Finalized Procedure:            Colonoscopy Indications:          Screening for colorectal malignant neoplasm Providers:            Madason Rauls B. Bonna Gains MD, MD Referring MD:         Ramonita Lab, MD (Referring MD) Medicines:            Monitored Anesthesia Care Complications:        No immediate complications. Procedure:            Pre-Anesthesia Assessment:                       - ASA Grade Assessment: II - A patient with mild                        systemic disease.                       - Prior to the procedure, a History and Physical was                        performed, and patient medications, allergies and                        sensitivities were reviewed. The patient's tolerance of                        previous anesthesia was reviewed.                       - The risks and benefits of the procedure and the                        sedation options and risks were discussed with the                        patient. All questions were answered and informed                        consent was obtained.                       - Patient identification and proposed procedure were                        verified prior to the procedure by the physician, the                        nurse, the anesthesiologist, the anesthetist and the                        technician. The procedure was verified in the procedure                        room.  After obtaining informed consent, the colonoscope was                        passed under direct vision. Throughout the procedure,                        the patient's blood pressure, pulse, and oxygen                        saturations were monitored continuously. The                   Colonoscope was introduced through the anus and                        advanced to the the cecum, identified by appendiceal                        orifice and ileocecal valve. The colonoscopy was                        performed with ease. The patient tolerated the                        procedure well. The quality of the bowel preparation                        was fair (Pt. stated she completed most of her prep but                        not all of it). Findings:      The perianal and digital rectal examinations were normal.      Two sessile polyps were found in the transverse colon. The polyps were 7       to 10 mm in size. The polyp was removed with a hot snare (10mm polyp)       and The polyp was removed with a cold snare (38mm polyp). Resection and       retrieval were complete.      The exam was otherwise without abnormality.      The rectum, sigmoid colon, descending colon, transverse colon, ascending       colon and cecum appeared normal.      Anal papilla(e) were hypertrophied.      The retroflexed view of the distal rectum and anal verge was normal and       showed no anal or rectal abnormalities. Impression:           - Preparation of the colon was fair.                       - Two 7 to 10 mm polyps in the transverse colon,                        removed with a cold snare and removed with a hot snare.                        Resected and retrieved.                       - The examination was otherwise normal.                       -  The rectum, sigmoid colon, descending colon,                        transverse colon, ascending colon and cecum are normal.                       - Anal papilla(e) were hypertrophied.                       - The distal rectum and anal verge are normal on                        retroflexion view. Recommendation:       - Discharge patient to home (with escort).                       - Advance diet as tolerated.                        - Continue present medications.                       - Await pathology results.                       - Repeat colonoscopy in 1 year for surveillance with 2                        day prep due to fair prep on today's exam.                       - The findings and recommendations were discussed with                        the patient.                       - The findings and recommendations were discussed with                        the patient's family.                       - Return to primary care physician as previously                        scheduled. Procedure Code(s):    --- Professional ---                       [phone], Colonoscopy, flexible; with removal of tumor(s),                        polyp(s), or other lesion(s) by snare technique Diagnosis Code(s):    --- Professional ---                       Z12.11, Encounter for screening for malignant neoplasm                        of colon                       D12.3, Benign neoplasm of transverse  colon (hepatic                        flexure or splenic flexure)                       K62.89, Other specified diseases of anus and rectum CPT copyright 2017 American Medical Association. All rights reserved. The codes documented in this report are preliminary and upon coder review may  be revised to meet current compliance requirements.  [name], MD Margretta Sidle B. Bonna Gains MD, MD 08/26/2017 3:42:55 PM This report has been signed electronically. Number of Addenda: 0 Note Initiated On: 08/26/2017 2:33 PM Scope Withdrawal Time: 0 hours 20 minutes 50 seconds  Total Procedure Duration: 0 hours 28 minutes 48 seconds  Estimated Blood Loss: Estimated blood loss: none.      [facility]

## 2017-08-26 NOTE — H&P (Signed)
Brittney Antigua, MD [address] [place], [place], [place], [address] [address], [place], [place], [place], [address] Phone: [phone]  Fax: [phone]  Primary Care Physician:  Adin Hector, MD   Pre-Procedure History & Physical: HPI:  Brittney Diaz is a 52 y.o. female is here for a colonoscopy and EGD.   Past Medical History:  Diagnosis Date  . Anemia    H/O  . Arthritis    LEFT KNEE  . Depression   . DVT (deep venous thrombosis) ([place]) 2013   while on BCP/ LEG X1-DUE TO HAVING NUVA RING  . GERD (gastroesophageal reflux disease)   . Headache(784.0)    migraines/ RARELY NOW  . Hip pain, acute    poss from long term use of Prednisone  . Pulmonary embolism ([facility]) 2013   from [employer]  . 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 CHOLECYSTECTOMY N/A 11/29/2015   Procedure: LAPAROSCOPIC CHOLECYSTECTOMY;  Surgeon: Ladora Daniel, MD;  Location: [facility];  Service: General;  Laterality: N/A;  . DILITATION & CURRETTAGE/HYSTROSCOPY WITH NOVASURE ABLATION N/A 07/30/2013   Procedure: DILATATION & CURETTAGE/HYSTEROSCOPY WITH  THERMACHOICE, AND POLYPECTOMY;  Surgeon: Lovenia Kim, MD;  Location: [facility];  Service: Gynecology;  Laterality: N/A;  . ESOPHAGOGASTRODUODENOSCOPY N/A 11/29/2015   Procedure: ESOPHAGOGASTRODUODENOSCOPY (EGD);  Surgeon: Ladora Daniel, MD;  Location: [facility];  Service: General;  Laterality: N/A;  . ESOPHAGOGASTRODUODENOSCOPY (EGD) WITH PROPOFOL N/A 03/03/2015   Procedure: ESOPHAGOGASTRODUODENOSCOPY (EGD) WITH PROPOFOL with balloon dilation;  Surgeon: Lucilla Lame, MD;  Location: [facility];  Service: Endoscopy;  Laterality: N/A;  . FEMUR FRACTURE SURGERY Right 1989  . GASTRIC BYPASS     12/14  . HYSTEROSCOPY W/ ENDOMETRIAL ABLATION    . JOINT REPLACEMENT Bilateral    HIPS  . LAPAROSCOPIC LYSIS OF ADHESIONS  11/29/2015   Procedure: LAPAROSCOPIC LYSIS OF ADHESIONS;   Surgeon: Ladora Daniel, MD;  Location: [facility];  Service: General;;  . LAPAROSCOPY  11/29/2015   Procedure: LAPAROSCOPY DIAGNOSTIC;  Surgeon: Ladora Daniel, MD;  Location: [facility];  Service: General;;  . NASAL SINUS SURGERY    . TOTAL HIP ARTHROPLASTY Bilateral 2015    Prior to Admission medications   Medication Sig Start Date End Date Taking? Authorizing Provider  desvenlafaxine (PRISTIQ) 100 MG 24 hr tablet Take 100 mg by mouth at bedtime.    [provider]  gabapentin (NEURONTIN) 300 MG capsule Take 300 mg by mouth 2 (two) times daily.    [provider]  LORazepam (ATIVAN) 0.5 MG tablet Take 0.5 mg by mouth as needed.  02/02/15   [provider]  metolazone (ZAROXOLYN) 2.5 MG tablet Take 2.5 mg by mouth every other day.    [provider]  Multiple Vitamin (MULTIVITAMIN) capsule Take 1 capsule by mouth daily.    [provider]  omeprazole (PRILOSEC) 20 MG capsule Take 20 mg by mouth every morning. EVE    [provider]  oxycodone (OXY-IR) 5 MG capsule Take 5 mg by mouth every 4 (four) hours as needed.     [provider]  potassium chloride (K-DUR) 10 MEQ tablet Take 10 mEq by mouth every other day. TAKES ON THE DAYS SHE TAKES TORSEMIDE AND METOLAZONE    [provider]  Rotigotine (NEUPRO) 8 MG/24HR PT24 Place 1 patch onto the skin daily.     [provider]  sucralfate (CARAFATE) 1 GM/10ML suspension Take 1 g by mouth as needed.     [provider]  torsemide (DEMADEX) 20 MG tablet Take 20 mg by mouth every other day.     [provider]  traZODone (DESYREL) 100 MG tablet Take 50 mg by mouth at bedtime as needed for sleep.    [provider]    Allergies as of 08/16/2017  . (No Known Allergies)    Family History  Problem Relation Age of Onset  . Pulmonary fibrosis Father   . Breast cancer Maternal Grandmother   . Heart disease Paternal Grandfather     Social  History   Socioeconomic History  . Marital status: Married    Spouse name: Not on file  . Number of children: Not on file  . Years of education: Not on file  . Highest education level: Not on file  Occupational History  . Not on file  Social Needs  . Financial resource strain: Not on file  . Food insecurity:    Worry: Not on file    Inability: Not on file  . Transportation needs:    Medical: Not on file    Non-medical: Not on file  Tobacco Use  . Smoking status: Never Smoker  . Smokeless tobacco: Never Used  Substance and Sexual Activity  . Alcohol use: Yes    Comment: OCC  . Drug use: No  . Sexual activity: Not on file  Lifestyle  . Physical activity:    Days per week: Not on file    Minutes per session: Not on file  . Stress: Not on file  Relationships  . Social connections:    Talks on phone: Not on file    Gets together: Not on file    Attends religious service: Not on file    Active member of club or organization: Not on file    Attends meetings of clubs or organizations: Not on file    Relationship status: Not on file  . Intimate partner violence:    Fear of current or ex partner: Not on file    Emotionally abused: Not on file    Physically abused: Not on file    Forced sexual activity: Not on file  Other Topics Concern  . Not on file  Social History Narrative  . Not on file    Review of Systems: See HPI, otherwise negative ROS  Physical Exam: There were no vitals taken for this visit. General:   Alert,  pleasant and cooperative in NAD Head:  Normocephalic and atraumatic. Neck:  Supple; no masses or thyromegaly. Lungs:  Clear throughout to auscultation, normal respiratory effort.    Heart:  +S1, +S2, Regular rate and rhythm, No edema. Abdomen:  Soft, nontender and nondistended. Normal bowel sounds, without guarding, and without rebound.   Neurologic:  Alert and  oriented x4;  grossly normal neurologically.  Impression/Plan: Brittney Diaz is  here for a colonoscopy to be performed for average risk screening and EGD for dysphagia  Risks, benefits, limitations, and alternatives regarding the procedures have been reviewed with the patient.  Questions have been answered.  All parties agreeable.   Brittney Manifold, MD  08/26/2017, 1:56 PM

## 2017-08-26 NOTE — Anesthesia Preprocedure Evaluation (Signed)
Anesthesia Evaluation  Patient identified by MRN, date of birth, ID band Patient awake    Reviewed: Allergy & Precautions, H&P , NPO status , Patient's Chart, lab work & pertinent test results, reviewed documented beta blocker date and time   History of Anesthesia Complications Negative for: history of anesthetic complications  Airway Mallampati: II  TM Distance: >3 FB Neck ROM: full    Dental  (+) Teeth Intact, Dental Advidsory Given   Pulmonary neg pulmonary ROS,           Cardiovascular Exercise Tolerance: Good negative cardio ROS       Neuro/Psych PSYCHIATRIC DISORDERS (Depression) negative neurological ROS     GI/Hepatic Neg liver ROS, GERD  ,  Endo/Other  negative endocrine ROS  Renal/GU negative Renal ROS  negative genitourinary   Musculoskeletal   Abdominal   Peds  Hematology  (+) Blood dyscrasia, anemia ,   Anesthesia Other Findings Past Medical History: No date: Anemia     Comment: H/O No date: Arthritis     Comment: LEFT KNEE No date: Depression 2013: DVT (deep venous thrombosis) ([facility])     Comment: while on BCP/ LEG X1-DUE TO HAVING NUVA RING No date: GERD (gastroesophageal reflux disease) No date: Headache(784.0)     Comment: migraines/ RARELY NOW No date: Hip pain, acute     Comment: poss from long term use of Prednisone 2013: Pulmonary embolism ([facility])     Comment: from Nuva Ring No date: TMJ syndrome     Comment: resolved after surgery   Reproductive/Obstetrics negative OB ROS                             Anesthesia Physical  Anesthesia Plan  ASA: II  Anesthesia Plan: General   Post-op Pain Management:    Induction: Intravenous  PONV Risk Score and Plan: 3 and Propofol infusion  Airway Management Planned: Nasal Cannula  Additional Equipment:   Intra-op Plan:   Post-operative Plan:   Informed Consent: I have reviewed the patients History and  Physical, chart, labs and discussed the procedure including the risks, benefits and alternatives for the proposed anesthesia with the patient or authorized representative who has indicated his/her understanding and acceptance.   Dental Advisory Given  Plan Discussed with: Anesthesiologist, CRNA and Surgeon  Anesthesia Plan Comments:         Anesthesia Quick Evaluation

## 2017-08-26 NOTE — Anesthesia Procedure Notes (Signed)
Date/Time: 08/26/2017 2:42 PM Performed by: Nelda Marseille, CRNA Pre-anesthesia Checklist: Patient identified, Emergency Drugs available, Suction available, Patient being monitored and Timeout performed Oxygen Delivery Method: Nasal cannula

## 2017-08-27 ENCOUNTER — Ambulatory Visit: Payer: BC Managed Care – PPO | Admitting: Gastroenterology

## 2017-08-28 ENCOUNTER — Encounter: Payer: Self-pay | Admitting: Gastroenterology

## 2017-08-28 LAB — SURGICAL PATHOLOGY

## 2017-09-09 ENCOUNTER — Other Ambulatory Visit: Payer: Self-pay

## 2017-09-09 MED ORDER — RANITIDINE HCL 75 MG PO TABS: 75.0000 mg | ORAL_TABLET | Freq: Every day | ORAL | 1 refills | Status: AC

## 2017-11-25 ENCOUNTER — Ambulatory Visit: Payer: BC Managed Care – PPO | Admitting: Gastroenterology

## 2018-03-14 ENCOUNTER — Emergency Department: Payer: BC Managed Care – PPO

## 2018-03-14 ENCOUNTER — Other Ambulatory Visit: Payer: Self-pay

## 2018-03-14 ENCOUNTER — Emergency Department
Admission: EM | Admit: 2018-03-14 | Discharge: 2018-03-14 | Disposition: A | Discharge status: Home / Self Care | Payer: BC Managed Care – PPO | Attending: Emergency Medicine | Admitting: Emergency Medicine

## 2018-03-14 DIAGNOSIS — Y92000 Kitchen of unspecified non-institutional (private) residence as  the place of occurrence of the external cause: Secondary | ICD-10-CM | POA: Diagnosis not present

## 2018-03-14 DIAGNOSIS — Y999 Unspecified external cause status: Secondary | ICD-10-CM | POA: Insufficient documentation

## 2018-03-14 DIAGNOSIS — E871 Hypo-osmolality and hyponatremia: Secondary | ICD-10-CM | POA: Insufficient documentation

## 2018-03-14 DIAGNOSIS — Z79899 Other long term (current) drug therapy: Secondary | ICD-10-CM | POA: Insufficient documentation

## 2018-03-14 DIAGNOSIS — R55 Syncope and collapse: Secondary | ICD-10-CM | POA: Insufficient documentation

## 2018-03-14 DIAGNOSIS — E876 Hypokalemia: Secondary | ICD-10-CM | POA: Insufficient documentation

## 2018-03-14 DIAGNOSIS — S299XXA Unspecified injury of thorax, initial encounter: Secondary | ICD-10-CM | POA: Diagnosis present

## 2018-03-14 DIAGNOSIS — Y9301 Activity, walking, marching and hiking: Secondary | ICD-10-CM | POA: Insufficient documentation

## 2018-03-14 DIAGNOSIS — S20212A Contusion of left front wall of thorax, initial encounter: Secondary | ICD-10-CM | POA: Insufficient documentation

## 2018-03-14 DIAGNOSIS — R252 Cramp and spasm: Secondary | ICD-10-CM | POA: Insufficient documentation

## 2018-03-14 DIAGNOSIS — W1839XA Other fall on same level, initial encounter: Secondary | ICD-10-CM | POA: Diagnosis not present

## 2018-03-14 DIAGNOSIS — E860 Dehydration: Secondary | ICD-10-CM | POA: Insufficient documentation

## 2018-03-14 LAB — COMPREHENSIVE METABOLIC PANEL
ALK PHOS: 98 U/L (ref 38–126)
ALT: 18 U/L (ref 0–44)
ANION GAP: 12 (ref 5–15)
AST: 30 U/L (ref 15–41)
Albumin: 3.6 g/dL (ref 3.5–5.0)
BILIRUBIN TOTAL: 0.7 mg/dL (ref 0.3–1.2)
BUN: 24 mg/dL — ABNORMAL HIGH (ref 6–20)
CALCIUM: 8 mg/dL — AB (ref 8.9–10.3)
CO2: 23 mmol/L (ref 22–32)
Chloride: 97 mmol/L — ABNORMAL LOW (ref 98–111)
Creatinine, Ser: 1.27 mg/dL — ABNORMAL HIGH (ref 0.44–1.00)
GFR, EST AFRICAN AMERICAN: 57 mL/min — AB (ref >60)
GFR, EST NON AFRICAN AMERICAN: 49 mL/min — AB (ref >60)
Glucose, Bld: 131 mg/dL — ABNORMAL HIGH (ref 70–99)
POTASSIUM: 3.3 mmol/L — AB (ref 3.5–5.1)
Sodium: 132 mmol/L — ABNORMAL LOW (ref 135–145)
TOTAL PROTEIN: 7 g/dL (ref 6.5–8.1)

## 2018-03-14 LAB — CBC WITH DIFFERENTIAL/PLATELET
Abs Immature Granulocytes: 0.09 10*3/uL — ABNORMAL HIGH (ref 0.00–0.07)
BASOS ABS: 0 10*3/uL (ref 0.0–0.1)
Basophils Relative: 0 %
EOS ABS: 0.1 10*3/uL (ref 0.0–0.5)
EOS PCT: 1 %
HEMATOCRIT: 36.9 % (ref 36.0–46.0)
HEMOGLOBIN: 12 g/dL (ref 12.0–15.0)
Immature Granulocytes: 1 %
LYMPHS ABS: 2.5 10*3/uL (ref 0.7–4.0)
LYMPHS PCT: 23 %
MCH: 28.8 pg (ref 26.0–34.0)
MCHC: 32.5 g/dL (ref 30.0–36.0)
MCV: 88.5 fL (ref 80.0–100.0)
Monocytes Absolute: 0.7 10*3/uL (ref 0.1–1.0)
Monocytes Relative: 6 %
NRBC: 0 % (ref 0.0–0.2)
Neutro Abs: 7.5 10*3/uL (ref 1.7–7.7)
Neutrophils Relative %: 69 %
Platelets: 416 10*3/uL — ABNORMAL HIGH (ref 150–400)
RBC: 4.17 MIL/uL (ref 3.87–5.11)
RDW: 14.6 % (ref 11.5–15.5)
WBC: 10.9 10*3/uL — AB (ref 4.0–10.5)

## 2018-03-14 LAB — TROPONIN I

## 2018-03-14 LAB — CK: Total CK: 510 U/L — ABNORMAL HIGH (ref 38–234)

## 2018-03-14 LAB — URINALYSIS, COMPLETE (UACMP) WITH MICROSCOPIC
Bilirubin Urine: NEGATIVE
Glucose, UA: NEGATIVE mg/dL
HGB URINE DIPSTICK: NEGATIVE
Ketones, ur: NEGATIVE mg/dL
Leukocytes, UA: NEGATIVE
NITRITE: NEGATIVE
PH: 7 (ref 5.0–8.0)
Protein, ur: NEGATIVE mg/dL
SPECIFIC GRAVITY, URINE: 1.012 (ref 1.005–1.030)
WBC UA: NONE SEEN WBC/hpf (ref 0–5)

## 2018-03-14 LAB — ETHANOL

## 2018-03-14 LAB — MAGNESIUM: MAGNESIUM: 2 mg/dL (ref 1.7–2.4)

## 2018-03-14 MED ORDER — SODIUM CHLORIDE 0.9 % IV BOLUS
1000.0000 mL | Freq: Once | INTRAVENOUS | Status: AC
  Administered 2018-03-14: 1000 mL via INTRAVENOUS

## 2018-03-14 MED ORDER — HYDROMORPHONE HCL 1 MG/ML IJ SOLN
0.5000 mg | Freq: Once | INTRAMUSCULAR | Status: AC
  Administered 2018-03-14: 0.5 mg via INTRAVENOUS
  Filled 2018-03-14: qty 1

## 2018-03-14 MED ORDER — FENTANYL CITRATE (PF) 100 MCG/2ML IJ SOLN
50.0000 ug | Freq: Once | INTRAMUSCULAR | Status: AC
  Administered 2018-03-14: 50 ug via INTRAVENOUS
  Filled 2018-03-14: qty 2

## 2018-03-14 MED ORDER — DIAZEPAM 2 MG PO TABS
2.0000 mg | ORAL_TABLET | Freq: Once | ORAL | Status: AC
Start: 2018-03-14 — End: 2018-03-14
  Administered 2018-03-14: 2 mg via ORAL
  Filled 2018-03-14: qty 1

## 2018-03-14 MED ORDER — DIAZEPAM 2 MG PO TABS: 2.0000 mg | ORAL_TABLET | Freq: Three times a day (TID) | ORAL | 0 refills | Status: DC | PRN

## 2018-03-14 MED ORDER — OXYCODONE-ACETAMINOPHEN 5-325 MG PO TABS
2.0000 | ORAL_TABLET | Freq: Once | ORAL | Status: AC
  Administered 2018-03-14: 2 via ORAL
  Filled 2018-03-14: qty 2

## 2018-03-14 MED ORDER — OXYCODONE-ACETAMINOPHEN 5-325 MG PO TABS: 1.0000 | ORAL_TABLET | ORAL | 0 refills | Status: AC | PRN

## 2018-03-14 MED ORDER — POTASSIUM CHLORIDE 10 MEQ/100ML IV SOLN
10.0000 meq | Freq: Once | INTRAVENOUS | Status: AC
  Administered 2018-03-14: 10 meq via INTRAVENOUS
  Filled 2018-03-14: qty 100

## 2018-03-14 MED ORDER — ONDANSETRON HCL 4 MG/2ML IJ SOLN
4.0000 mg | Freq: Once | INTRAMUSCULAR | Status: AC
  Administered 2018-03-14: 4 mg via INTRAVENOUS
  Filled 2018-03-14: qty 2

## 2018-03-14 MED ORDER — IOPAMIDOL (ISOVUE-300) INJECTION 61%
100.0000 mL | Freq: Once | INTRAVENOUS | Status: AC | PRN
  Administered 2018-03-14: 100 mL via INTRAVENOUS

## 2018-03-14 MED ORDER — POTASSIUM CHLORIDE 10 MEQ/100ML IV SOLN
10.0000 meq | Freq: Once | INTRAVENOUS | Status: DC
  Filled 2018-03-14 (×2): qty 100

## 2018-03-14 NOTE — ED Provider Notes (Signed)
Patient is in no distress, she is cleared for outpatient follow-up.   Earleen Newport, MD 03/14/18 [phone]

## 2018-03-14 NOTE — ED Triage Notes (Signed)
Pt arrived via [facility] from home with c/o synopal episode and leg cramping. EMS states pt started cramping in both legs in bed at 0140. EMS states pt went to walk to kitchen when she passed out and fell over couch and then went over and hit head on floor. EMS states pt has ulcer needing surgery on 1/13.

## 2018-03-14 NOTE — ED Provider Notes (Signed)
[facility] Emergency Department Provider Note   ____________________________________________   None    (approximate)  I have reviewed the triage vital signs and the nursing notes.   HISTORY  Chief Complaint Loss of Consciousness    HPI Brittney Diaz is a 53 y.o. female brought to the ED from home via EMS status post syncopal episode.  Patient awoke at 140 in the morning with bilateral leg cramping.  She was walking to the kitchen when she passed out from the pain in her legs.  Struck her left ribs against arm of the couch and husband states she then pitched over the couch and struck her head.  States LOC less than 1 minute.  Came to and he began to rehydrate her.  States she was boxing today and did not hydrate herself properly.  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  She is supposed to take potassium pills but has not been taking since she was placed on a liquid diet.  She does have a history of PE 5 years ago but has not been taking anticoagulants for several years.  They are planning to place her on Eliquis 1 week status post surgery empirically.  Denies recent fever, chills, chest pain, shortness of breath, abdominal pain, nausea, vomiting, dysuria or diarrhea.  Denies recent travel.   Past Medical History:  Diagnosis Date  . Anemia    H/O  . Arthritis    LEFT KNEE  . Depression   . DVT (deep venous thrombosis) ([place]) 2013   while on BCP/ LEG X1-DUE TO HAVING NUVA RING  . GERD (gastroesophageal reflux disease)   . Headache(784.0)    migraines/ RARELY NOW  . Hip pain, acute    poss from long term use of Prednisone  . Pulmonary embolism ([place]) 2013   from [employer]  . 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 Special screening for malignant neoplasms, colon   . Benign neoplasm of  transverse colon   . Hypertrophy, anal papillae   . Difficulty in swallowing   . Intestinal bypass or anastomosis status   . Avascular necrosis of bone of hip ([place]) 03/01/2015  . B12 deficiency 03/01/2015  . DUB (dysfunctional uterine bleeding) 03/01/2015  . Headache, migraine 03/01/2015  . Frequent UTI 03/01/2015  . Arthralgia of temporomandibular joint 03/01/2015  . Anemia, iron deficiency 08/23/2014  . Abnormal gait 04/20/2014  . 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 History of repair of hip joint 03/30/2014  . Leg swelling 03/30/2014  . H/O total hip arthroplasty 03/23/2014  . Arthralgia of hip 01/12/2014  . Acid reflux 11/10/2013  . 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 Restless leg 11/10/2013  . Avascular necrosis of bone ([place]) 07/27/2013    Past Surgical History:  Procedure Laterality Date  . CHOLECYSTECTOMY N/A 11/29/2015   Procedure: LAPAROSCOPIC CHOLECYSTECTOMY;  Surgeon: Ladora Daniel, MD;  Location: [facility] ORS;  Service: General;  Laterality: N/A;  . COLONOSCOPY WITH PROPOFOL N/A 08/26/2017   Procedure: COLONOSCOPY WITH PROPOFOL;  Surgeon: Virgel Manifold, MD;  Location: [facility] ENDOSCOPY;  Service: Endoscopy;  Laterality: N/A;  . DILITATION & CURRETTAGE/HYSTROSCOPY WITH NOVASURE ABLATION N/A 07/30/2013   Procedure: DILATATION & CURETTAGE/HYSTEROSCOPY WITH  THERMACHOICE, AND POLYPECTOMY;  Surgeon: Lovenia Kim, MD;  Location:  [place] [facility];  Service: Gynecology;  Laterality: N/A;  . ESOPHAGOGASTRODUODENOSCOPY N/A 11/29/2015   Procedure: ESOPHAGOGASTRODUODENOSCOPY (EGD);  Surgeon: Ladora Daniel, MD;  Location: [facility] ORS;  Service: General;  Laterality: N/A;  . ESOPHAGOGASTRODUODENOSCOPY (EGD) WITH PROPOFOL N/A 03/03/2015   Procedure: ESOPHAGOGASTRODUODENOSCOPY (EGD) WITH PROPOFOL with balloon dilation;  Surgeon: Lucilla Lame, MD;  Location: [place];  Service: Endoscopy;  Laterality: N/A;  . ESOPHAGOGASTRODUODENOSCOPY (EGD) WITH  PROPOFOL N/A 08/26/2017   Procedure: ESOPHAGOGASTRODUODENOSCOPY (EGD) WITH PROPOFOL;  Surgeon: Virgel Manifold, MD;  Location: [facility] END[facility]COPY;  Service: Endoscopy;  Laterality: N/A;  . FEMUR FRACTURE SURGERY Right 1989  . GASTRIC BYPASS     12/14  . HYSTEROSCOPY W/ ENDOMETRIAL ABLATION    . JOINT REPLACEMENT Bilateral    HIPS  . LAPAROSCOPIC LYSIS OF ADHESIONS  11/29/2015   Procedure: LAPAROSCOPIC LYSIS OF ADHESIONS;  Surgeon: Ladora Daniel, MD;  Location: [facility] ORS;  Service: General;;  . LAPAROSCOPY  11/29/2015   Procedure: LAPAROSCOPY DIAGNOSTIC;  Surgeon: Ladora Daniel, MD;  Location: [facility] ORS;  Service: General;;  . NASAL SINUS SURGERY    . TOTAL HIP ARTHROPLASTY Bilateral 2015    Prior to Admission medications   Medication Sig Start Date End Date Taking? Authorizing Provider  desvenlafaxine (PRISTIQ) 100 MG 24 hr tablet Take 100 mg by mouth at bedtime.    [provider]  gabapentin (NEURONTIN) 300 MG capsule Take 300 mg by mouth 2 (two) times daily.    [provider]  LORazepam (ATIVAN) 0.5 MG tablet Take 0.5 mg by mouth as needed.  02/02/15   [provider]  metolazone (ZAROXOLYN) 2.5 MG tablet Take 2.5 mg by mouth every other day.    [provider]  Multiple Vitamin (MULTIVITAMIN) capsule Take 1 capsule by mouth daily.    [provider]  omeprazole (PRILOSEC) 20 MG capsule Take 20 mg by mouth every morning. EVE    [provider]  oxycodone (OXY-IR) 5 MG capsule Take 5 mg by mouth every 4 (four) hours as needed.     [provider]  potassium chloride (K-DUR) 10 MEQ tablet Take 10 mEq by mouth every other day. TAKES ON THE DAYS SHE TAKES TORSEMIDE AND METOLAZONE    [provider]  ranitidine (ZANTAC) 75 MG tablet Take 1 tablet (75 mg total) by mouth at bedtime. 09/09/17   Virgel Manifold, MD  Rotigotine (NEUPRO) 8 MG/24HR PT24 Place 1 patch onto the skin daily.     [provider]    sucralfate (CARAFATE) 1 GM/10ML suspension Take 1 g by mouth as needed.     [provider]  torsemide (DEMADEX) 20 MG tablet Take 20 mg by mouth every other day.     [provider]  traZODone (DESYREL) 100 MG tablet Take 50 mg by mouth at bedtime as needed for sleep.    [provider]    Allergies Patient has no known allergies.  Family History  Problem Relation Age of Onset  . Pulmonary fibrosis Father   . Breast cancer Maternal Grandmother   . Heart disease Paternal Grandfather     Social History Social History   Tobacco Use  . Smoking status: Never Smoker  . Smokeless tobacco: Never Used  Substance Use Topics  . Alcohol use: Yes    Comment: OCC  . Drug use: No    Review of Systems  Constitutional: No fever/chills Eyes: No visual changes. ENT: No sore throat. Cardiovascular: Denies chest pain.  Respiratory: Positive for left ribs pain.  Denies shortness of breath. Gastrointestinal: No abdominal pain.  No nausea, no vomiting.  No diarrhea.  No constipation. Genitourinary: Negative for dysuria. Musculoskeletal: Positive for bilateral leg cramps.  Negative for back pain. Skin: Negative for rash. Neurological: Positive for syncope.  Negative for headaches, focal weakness or numbness.   ____________________________________________   PHYSICAL EXAM:  VITAL SIGNS: ED Triage Vitals  Enc Vitals Group     BP 03/14/18 0432 (!) 103/46     Pulse Rate 03/14/18 0432 73     Resp 03/14/18 0432 18     Temp 03/14/18 0432 98.1 F (36.7 C)     Temp Source 03/14/18 0432 Oral     SpO2 03/14/18 0432 100 %     Weight 03/14/18 0432 220 lb (99.8 kg)     Height 03/14/18 0432 5\' 6"  (1.676 m)     Head Circumference --      Peak Flow --      Pain Score 03/14/18 0431 4     Pain Loc --      Pain Edu? --      Excl. in [place]? --     Constitutional: Alert and oriented.  Uncomfortable appearing and in mild acute distress. Eyes: Conjunctivae are normal.  PERRL. EOMI. Head: Atraumatic. Nose: Atraumatic. Mouth/Throat: Mucous membranes are moist.  No dental malocclusion. Neck: No stridor.  No cervical spine tenderness to palpation. Cardiovascular: Normal rate, regular rhythm. Grossly normal heart sounds.  Good peripheral circulation. Respiratory: Normal respiratory effort.  No retractions. Lungs CTAB.  No crepitus.  No splinting.  Left lower ribs tender to palpation.  Gastrointestinal: Soft and nontender. No distention. No abdominal bruits. No CVA tenderness. Musculoskeletal: No lower extremity tenderness nor edema.  No joint effusions. Neurologic: Alert and oriented x3.  CN II-XII grossly intact.  Normal speech and language. No gross focal neurologic deficits are appreciated. Skin:  Skin is warm, dry and intact. No rash noted. Psychiatric: Mood and affect are normal. Speech and behavior are normal.  ____________________________________________   LABS (all labs ordered are listed, but only abnormal results are displayed)  Labs Reviewed  CBC WITH DIFFERENTIAL/PLATELET - Abnormal; Notable for the following components:      Result Value   WBC 10.9 (*)    Platelets 416 (*)    Abs Immature Granulocytes 0.09 (*)    All other components within normal limits  COMPREHENSIVE METABOLIC PANEL - Abnormal; Notable for the following components:   Sodium 132 (*)    Potassium 3.3 (*)    Chloride 97 (*)    Glucose, Bld 131 (*)    BUN 24 (*)    Creatinine, Ser 1.27 (*)    Calcium 8.0 (*)    GFR calc non Af Amer 49 (*)    GFR calc Af Amer 57 (*)    All other components within normal limits  URINALYSIS, COMPLETE (UACMP) WITH MICROSCOPIC - Abnormal; Notable for the following components:   Color, Urine STRAW (*)    APPearance CLEAR (*)    Bacteria, UA RARE (*)    All other components within normal limits  CK - Abnormal; Notable for the following components:   Total CK 510 (*)    All other components within normal limits  ETHANOL  TROPONIN I   MAGNESIUM   ____________________________________________  EKG  ED ECG REPORT I, Anaiah Mcmannis J, the attending physician, personally viewed and interpreted this ECG.   Date: 03/14/2018  EKG Time: 0437  Rate:  67  Rhythm: normal EKG, normal sinus rhythm  Axis: Normal  Intervals:none  ST&T Change: Nonspecific  ____________________________________________  RADIOLOGY  ED MD interpretation: No ICH, no chest/abdomen/pelvis internal organ injuries  Official radiology report(s): Ct Head Wo Contrast  Result Date: 03/14/2018 CLINICAL DATA:  Acute onset of syncope and leg cramping. Hit head on floor. Headache. EXAM: CT HEAD WITHOUT CONTRAST TECHNIQUE: Contiguous axial images were obtained from the base of the skull through the vertex without intravenous contrast. COMPARISON:  None. FINDINGS: Brain: No evidence of acute infarction, hemorrhage, hydrocephalus, extra-axial collection or mass lesion/mass effect. The posterior fossa, including the cerebellum, brainstem and fourth ventricle, is within normal limits. The third and lateral ventricles, and basal ganglia are unremarkable in appearance. The cerebral hemispheres are symmetric in appearance, with normal gray-white differentiation. No mass effect or midline shift is seen. Vascular: No hyperdense vessel or unexpected calcification. Skull: There is no evidence of fracture; visualized osseous structures are unremarkable in appearance. Sinuses/Orbits: The visualized portions of the orbits are within normal limits. The paranasal sinuses and mastoid air cells are well-aerated. Other: No significant soft tissue abnormalities are seen. IMPRESSION: No evidence of traumatic intracranial injury or fracture. Electronically Signed   By: Garald Balding M.D.   On: 03/14/2018 06:18   Ct Chest W Contrast  Result Date: 03/14/2018 CLINICAL DATA:  Status post syncope and fall. Left-sided rib pain. Concern for abdominal injury. Initial encounter. EXAM: CT CHEST, ABDOMEN,  AND PELVIS WITH CONTRAST TECHNIQUE: Multidetector CT imaging of the chest, abdomen and pelvis was performed following the standard protocol during bolus administration of intravenous contrast. CONTRAST:  128mL ISOVUE-300 IOPAMIDOL (ISOVUE-300) INJECTION 61% COMPARISON:  CT of the abdomen and pelvis from 11/18/2015, and CTA of the chest performed 12/14/2011 FINDINGS: CT CHEST FINDINGS Cardiovascular: The heart is normal in size. Mild calcification is noted at the aortic arch. The great vessels are unremarkable in appearance. There is no evidence of aortic injury. There is no evidence of venous hemorrhage. Mediastinum/Nodes: The mediastinum is unremarkable in appearance. No mediastinal lymphadenopathy is seen. No pericardial effusion is identified. The visualized portions of the thyroid gland are unremarkable. No axillary lymphadenopathy is appreciated. Lungs/Pleura: Minimal bilateral dependent subsegmental atelectasis is noted. No focal consolidation, pleural effusion or pneumothorax is seen. There is no evidence of pulmonary parenchymal contusion. No masses are seen. Musculoskeletal: No acute osseous abnormalities are identified. The visualized musculature is unremarkable in appearance. CT ABDOMEN PELVIS FINDINGS Hepatobiliary: The liver is unremarkable in appearance. The patient is status post cholecystectomy. The common bile duct remains normal in caliber. Pancreas: The pancreas is within normal limits. Spleen: The spleen is unremarkable in appearance. Adrenals/Urinary Tract: The adrenal glands are unremarkable in appearance. The kidneys are within normal limits. There is no evidence of hydronephrosis. No renal or ureteral stones are identified. No perinephric stranding is seen. Stomach/Bowel: The patient is status post gastric bypass surgery. The gastrojejunal anastomosis is unremarkable. A small amount of fluid is noted within the distal stomach. Bowel suture lines are noted about the small bowel. The remaining  small bowel is unremarkable in appearance. The appendix is decompressed, without evidence of appendicitis. The colon is unremarkable in appearance. Vascular/Lymphatic: The abdominal aorta is unremarkable in appearance. The inferior vena cava is grossly unremarkable. No retroperitoneal lymphadenopathy is seen. No pelvic sidewall lymphadenopathy is identified. Reproductive: The bladder is moderately distended and grossly unremarkable. The uterus is unremarkable in appearance. The ovaries are grossly symmetric. No suspicious adnexal masses are seen. Other: No additional soft tissue  abnormalities are seen. Musculoskeletal: No acute osseous abnormalities are identified. The patient's bilateral hip arthroplasties are grossly unremarkable in appearance, though incompletely imaged. The visualized musculature is unremarkable in appearance. IMPRESSION: 1. No evidence of traumatic injury to the chest, abdomen or pelvis. 2. Minimal bilateral dependent subsegmental atelectasis noted. Lungs otherwise clear. Electronically Signed   By: Garald Balding M.D.   On: 03/14/2018 06:23   Ct Abdomen Pelvis W Contrast  Result Date: 03/14/2018 CLINICAL DATA:  Status post syncope and fall. Left-sided rib pain. Concern for abdominal injury. Initial encounter. EXAM: CT CHEST, ABDOMEN, AND PELVIS WITH CONTRAST TECHNIQUE: Multidetector CT imaging of the chest, abdomen and pelvis was performed following the standard protocol during bolus administration of intravenous contrast. CONTRAST:  151mL ISOVUE-300 IOPAMIDOL (ISOVUE-300) INJECTION 61% COMPARISON:  CT of the abdomen and pelvis from 11/18/2015, and CTA of the chest performed 12/14/2011 FINDINGS: CT CHEST FINDINGS Cardiovascular: The heart is normal in size. Mild calcification is noted at the aortic arch. The great vessels are unremarkable in appearance. There is no evidence of aortic injury. There is no evidence of venous hemorrhage. Mediastinum/Nodes: The mediastinum is unremarkable in  appearance. No mediastinal lymphadenopathy is seen. No pericardial effusion is identified. The visualized portions of the thyroid gland are unremarkable. No axillary lymphadenopathy is appreciated. Lungs/Pleura: Minimal bilateral dependent subsegmental atelectasis is noted. No focal consolidation, pleural effusion or pneumothorax is seen. There is no evidence of pulmonary parenchymal contusion. No masses are seen. Musculoskeletal: No acute osseous abnormalities are identified. The visualized musculature is unremarkable in appearance. CT ABDOMEN PELVIS FINDINGS Hepatobiliary: The liver is unremarkable in appearance. The patient is status post cholecystectomy. The common bile duct remains normal in caliber. Pancreas: The pancreas is within normal limits. Spleen: The spleen is unremarkable in appearance. Adrenals/Urinary Tract: The adrenal glands are unremarkable in appearance. The kidneys are within normal limits. There is no evidence of hydronephrosis. No renal or ureteral stones are identified. No perinephric stranding is seen. Stomach/Bowel: The patient is status post gastric bypass surgery. The gastrojejunal anastomosis is unremarkable. A small amount of fluid is noted within the distal stomach. Bowel suture lines are noted about the small bowel. The remaining small bowel is unremarkable in appearance. The appendix is decompressed, without evidence of appendicitis. The colon is unremarkable in appearance. Vascular/Lymphatic: The abdominal aorta is unremarkable in appearance. The inferior vena cava is grossly unremarkable. No retroperitoneal lymphadenopathy is seen. No pelvic sidewall lymphadenopathy is identified. Reproductive: The bladder is moderately distended and grossly unremarkable. The uterus is unremarkable in appearance. The ovaries are grossly symmetric. No suspicious adnexal masses are seen. Other: No additional soft tissue abnormalities are seen. Musculoskeletal: No acute osseous abnormalities are  identified. The patient's bilateral hip arthroplasties are grossly unremarkable in appearance, though incompletely imaged. The visualized musculature is unremarkable in appearance. IMPRESSION: 1. No evidence of traumatic injury to the chest, abdomen or pelvis. 2. Minimal bilateral dependent subsegmental atelectasis noted. Lungs otherwise clear. Electronically Signed   By: Garald Balding M.D.   On: 03/14/2018 06:23   Dg Chest Port 1 View  Result Date: 03/14/2018 CLINICAL DATA:  Acute onset of syncope. EXAM: PORTABLE CHEST 1 VIEW COMPARISON:  Chest radiograph performed 12/12/2012, and CT of the chest performed earlier today at 5:48 a.m. FINDINGS: The lungs are well-aerated and clear. There is no evidence of focal opacification, pleural effusion or pneumothorax. The cardiomediastinal silhouette is within normal limits. No acute osseous abnormalities are seen. IMPRESSION: No acute cardiopulmonary process seen. Electronically Signed   By:  Garald Balding M.D.   On: 03/14/2018 06:27    ____________________________________________   PROCEDURES  Procedure(s) performed: None  Procedures  Critical Care performed: Yes, see critical care note(s)   CRITICAL CARE Performed by: Paulette Blanch   Total critical care time: 30 minutes  Critical care time was exclusive of separately billable procedures and treating other patients.  Critical care was necessary to treat or prevent imminent or life-threatening deterioration.  Critical care was time spent personally by me on the following activities: development of treatment plan with patient and/or surrogate as well as nursing, discussions with consultants, evaluation of patient's response to treatment, examination of patient, obtaining history from patient or surrogate, ordering and performing treatments and interventions, ordering and review of laboratory studies, ordering and review of radiographic studies, pulse oximetry and re-evaluation of patient's  condition.  ____________________________________________   INITIAL IMPRESSION / ASSESSMENT AND PLAN / ED COURSE  As part of my medical decision making, I reviewed the following data within the [place] History obtained from family, Nursing notes reviewed and incorporated, Labs reviewed, EKG interpreted, Old chart reviewed, Radiograph reviewed and Notes from prior ED visits   53 year old female who presents with bilateral leg cramping, syncope and fall.  Differential diagnosis includes but is not limited to electrolyte abnormality, ICH, rib fractures, splenic laceration, etc.  Will obtain CT imaging studies of head, chest abdomen/pelvis.  Initiate IV fluid resuscitation.  50 mcg IV fentanyl given for pain, paired with 4 mg IV Zofran for nausea.  Will also administer oral Valium for muscle cramping.  Clinical Course as of Mar 15 711  Fri Mar 14, 2018  0654 Patient sleeping in no acute distress after administration of IV Dilaudid.  Updated patient and spouse on all test results with the exception of CK which is still pending.  Anticipate discharge home after completion of IV potassium.  Spouse tells me patient can actually take pills up until January 8.  Will discharge home with Percocet for pain and Valium for muscle relaxation.   [JS]  6222 CK is 510.  Care transferred to Dr. Jimmye Norman.  Anticipate discharge home after completion of IV potassium.   [JS]    Clinical Course User Index [JS] Paulette Blanch, MD     ____________________________________________   FINAL CLINICAL IMPRESSION(S) / ED DIAGNOSES  Final diagnoses:  Syncope, unspecified syncope type  Dehydration  Hypokalemia  Hyponatremia  Leg cramps  Rib contusion, left, initial encounter     ED Discharge Orders    None       Note:  This document was prepared using Dragon voice recognition software and may include unintentional dictation errors.    Paulette Blanch, MD 03/14/18 [phone]

## 2018-03-14 NOTE — Discharge Instructions (Signed)
1.  You may take medicines as needed for pain and muscle cramps (Percocet/Valium #15). 2.  Drink fluids with electrolytes (Gatorade, Powerade or electrolyte mix). 3.  Return to the ER for worsening symptoms, persistent vomiting, difficulty breathing or other concerns.

## 2018-03-14 NOTE — ED Notes (Signed)
Pt off the floor in CT scan at this time

## 2018-03-14 NOTE — ED Notes (Signed)
Pt reporting pain continues when moving but dissipates when pt is able to lay still. Pt and husband both verbalize feeling safe. Going home. Husband helped to dress pt and RN wheeled pt to truck. Husband and RN assisted pt into car.

## 2018-03-14 NOTE — ED Notes (Signed)
Husband at bedside. Initial assessment completed.Call bell within reach. Pt remains on cardiac monitor at this time.

## 2018-04-01 ENCOUNTER — Other Ambulatory Visit
Admission: RE | Admit: 2018-04-01 | Discharge: 2018-04-01 | Disposition: A | Discharge status: Home / Self Care | Payer: BC Managed Care – PPO | Source: Ambulatory Visit | Attending: Specialist | Admitting: Specialist

## 2018-04-01 DIAGNOSIS — Z9884 Bariatric surgery status: Secondary | ICD-10-CM | POA: Diagnosis present

## 2018-04-01 LAB — CBC WITH DIFFERENTIAL/PLATELET
ABS IMMATURE GRANULOCYTES: 0.03 10*3/uL (ref 0.00–0.07)
BASOS ABS: 0.1 10*3/uL (ref 0.0–0.1)
BASOS PCT: 1 %
EOS ABS: 0.4 10*3/uL (ref 0.0–0.5)
Eosinophils Relative: 5 %
HCT: 41.8 % (ref 36.0–46.0)
Hemoglobin: 13.4 g/dL (ref 12.0–15.0)
IMMATURE GRANULOCYTES: 0 %
LYMPHS ABS: 2.9 10*3/uL (ref 0.7–4.0)
Lymphocytes Relative: 34 %
MCH: 28.6 pg (ref 26.0–34.0)
MCHC: 32.1 g/dL (ref 30.0–36.0)
MCV: 89.1 fL (ref 80.0–100.0)
MONOS PCT: 7 %
Monocytes Absolute: 0.6 10*3/uL (ref 0.1–1.0)
NEUTROS ABS: 4.6 10*3/uL (ref 1.7–7.7)
NEUTROS PCT: 53 %
NRBC: 0 % (ref 0.0–0.2)
PLATELETS: 557 10*3/uL — AB (ref 150–400)
RBC: 4.69 MIL/uL (ref 3.87–5.11)
RDW: 14.2 % (ref 11.5–15.5)
WBC: 8.6 10*3/uL (ref 4.0–10.5)

## 2018-04-01 LAB — BASIC METABOLIC PANEL
ANION GAP: 10 (ref 5–15)
BUN: 30 mg/dL — ABNORMAL HIGH (ref 6–20)
CALCIUM: 9.1 mg/dL (ref 8.9–10.3)
CO2: 31 mmol/L (ref 22–32)
Chloride: 95 mmol/L — ABNORMAL LOW (ref 98–111)
Creatinine, Ser: 1.17 mg/dL — ABNORMAL HIGH (ref 0.44–1.00)
GFR, EST NON AFRICAN AMERICAN: 54 mL/min — AB (ref >60)
Glucose, Bld: 101 mg/dL — ABNORMAL HIGH (ref 70–99)
POTASSIUM: 3 mmol/L — AB (ref 3.5–5.1)
Sodium: 136 mmol/L (ref 135–145)

## 2018-08-18 ENCOUNTER — Other Ambulatory Visit: Payer: Self-pay | Admitting: Internal Medicine

## 2018-08-18 DIAGNOSIS — R748 Abnormal levels of other serum enzymes: Secondary | ICD-10-CM

## 2018-08-19 ENCOUNTER — Ambulatory Visit
Admission: RE | Admit: 2018-08-19 | Discharge: 2018-08-19 | Disposition: A | Discharge status: Home / Self Care | Payer: BC Managed Care – PPO | Source: Ambulatory Visit | Attending: Internal Medicine | Admitting: Internal Medicine

## 2018-08-19 ENCOUNTER — Other Ambulatory Visit: Payer: Self-pay

## 2018-08-19 DIAGNOSIS — R748 Abnormal levels of other serum enzymes: Secondary | ICD-10-CM | POA: Diagnosis present

## 2018-08-28 ENCOUNTER — Other Ambulatory Visit: Payer: Self-pay | Admitting: Student

## 2018-08-28 DIAGNOSIS — R748 Abnormal levels of other serum enzymes: Secondary | ICD-10-CM

## 2018-09-19 ENCOUNTER — Ambulatory Visit: Payer: BC Managed Care – PPO

## 2018-11-27 ENCOUNTER — Other Ambulatory Visit: Payer: Self-pay | Admitting: Internal Medicine

## 2018-11-27 DIAGNOSIS — R748 Abnormal levels of other serum enzymes: Secondary | ICD-10-CM

## 2018-12-02 ENCOUNTER — Other Ambulatory Visit: Payer: Self-pay | Admitting: Physician Assistant

## 2018-12-03 ENCOUNTER — Ambulatory Visit
Admission: RE | Admit: 2018-12-03 | Discharge: 2018-12-03 | Disposition: A | Discharge status: Home / Self Care | Payer: BC Managed Care – PPO | Source: Ambulatory Visit | Attending: Internal Medicine | Admitting: Internal Medicine

## 2018-12-03 ENCOUNTER — Other Ambulatory Visit: Payer: Self-pay

## 2018-12-03 DIAGNOSIS — F329 Major depressive disorder, single episode, unspecified: Secondary | ICD-10-CM | POA: Diagnosis not present

## 2018-12-03 DIAGNOSIS — Z86711 Personal history of pulmonary embolism: Secondary | ICD-10-CM | POA: Diagnosis not present

## 2018-12-03 DIAGNOSIS — Z79899 Other long term (current) drug therapy: Secondary | ICD-10-CM | POA: Diagnosis not present

## 2018-12-03 DIAGNOSIS — R748 Abnormal levels of other serum enzymes: Secondary | ICD-10-CM | POA: Diagnosis present

## 2018-12-03 DIAGNOSIS — Z96643 Presence of artificial hip joint, bilateral: Secondary | ICD-10-CM | POA: Diagnosis not present

## 2018-12-03 DIAGNOSIS — Z803 Family history of malignant neoplasm of breast: Secondary | ICD-10-CM | POA: Insufficient documentation

## 2018-12-03 DIAGNOSIS — M1712 Unilateral primary osteoarthritis, left knee: Secondary | ICD-10-CM | POA: Diagnosis not present

## 2018-12-03 DIAGNOSIS — Z9049 Acquired absence of other specified parts of digestive tract: Secondary | ICD-10-CM | POA: Diagnosis not present

## 2018-12-03 DIAGNOSIS — K219 Gastro-esophageal reflux disease without esophagitis: Secondary | ICD-10-CM | POA: Insufficient documentation

## 2018-12-03 LAB — CBC
HCT: 37.7 % (ref 36.0–46.0)
Hemoglobin: 12.3 g/dL (ref 12.0–15.0)
MCH: 30.8 pg (ref 26.0–34.0)
MCHC: 32.6 g/dL (ref 30.0–36.0)
MCV: 94.3 fL (ref 80.0–100.0)
Platelets: 378 10*3/uL (ref 150–400)
RBC: 4 MIL/uL (ref 3.87–5.11)
RDW: 14.3 % (ref 11.5–15.5)
WBC: 7.7 10*3/uL (ref 4.0–10.5)
nRBC: 0 % (ref 0.0–0.2)

## 2018-12-03 LAB — PROTIME-INR
INR: 1 (ref 0.8–1.2)
Prothrombin Time: 12.9 seconds (ref 11.4–15.2)

## 2018-12-03 MED ORDER — FENTANYL CITRATE (PF) 100 MCG/2ML IJ SOLN
INTRAMUSCULAR | Status: AC
  Filled 2018-12-03: qty 2

## 2018-12-03 MED ORDER — MIDAZOLAM HCL 5 MG/5ML IJ SOLN
INTRAMUSCULAR | Status: AC
  Filled 2018-12-03: qty 5

## 2018-12-03 MED ORDER — SODIUM CHLORIDE 0.9 % IV SOLN: INTRAVENOUS | Status: DC

## 2018-12-03 MED ORDER — MIDAZOLAM HCL 2 MG/2ML IJ SOLN
INTRAMUSCULAR | Status: AC | PRN
  Administered 2018-12-03 (×3): 1 mg via INTRAVENOUS

## 2018-12-03 MED ORDER — FENTANYL CITRATE (PF) 100 MCG/2ML IJ SOLN
INTRAMUSCULAR | Status: AC | PRN
  Administered 2018-12-03 (×2): 50 ug via INTRAVENOUS

## 2018-12-03 NOTE — H&P (Signed)
Chief Complaint: Transaminitis  Referring Physician(s): Efrain Sella  Supervising Physician: Corrie Mckusick  Patient Status: [facility] - Out-pt  History of Present Illness: Brittney Diaz is a 53 y.o. female presenting to [facility] with transaminitis, and possible image guided biopsy.   She denies any recent or new fever, rigors, chills.   Past Medical History:  Diagnosis Date  . Anemia    H/O  . Arthritis    LEFT KNEE  . Depression   . DVT (deep venous thrombosis) ([place]) 2013   while on BCP/ LEG X1-DUE TO HAVING NUVA RING  . GERD (gastroesophageal reflux disease)   . Headache(784.0)    migraines/ RARELY NOW  . Hip pain, acute    poss from long term use of Prednisone  . Pulmonary embolism ([place]) 2013   from [employer]  . 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 CHOLECYSTECTOMY N/A 11/29/2015   Procedure: LAPAROSCOPIC CHOLECYSTECTOMY;  Surgeon: Ladora Daniel, MD;  Location: [facility];  Service: General;  Laterality: N/A;  . COLONOSCOPY WITH PROPOFOL N/A 08/26/2017   Procedure: COLONOSCOPY WITH PROPOFOL;  Surgeon: Virgel Manifold, MD;  Location: [facility];  Service: Endoscopy;  Laterality: N/A;  . DILITATION & CURRETTAGE/HYSTROSCOPY WITH NOVASURE ABLATION N/A 07/30/2013   Procedure: DILATATION & CURETTAGE/HYSTEROSCOPY WITH  THERMACHOICE, AND POLYPECTOMY;  Surgeon: Lovenia Kim, MD;  Location: [place] [facility];  Service: Gynecology;  Laterality: N/A;  . ESOPHAGOGASTRODUODENOSCOPY N/A 11/29/2015   Procedure: ESOPHAGOGASTRODUODENOSCOPY (EGD);  Surgeon: Ladora Daniel, MD;  Location: [facility];  Service: General;  Laterality: N/A;  . ESOPHAGOGASTRODUODENOSCOPY (EGD) WITH PROPOFOL N/A 03/03/2015   Procedure: ESOPHAGOGASTRODUODENOSCOPY (EGD) WITH PROPOFOL with balloon dilation;  Surgeon: Lucilla Lame, MD;  Location: [place];  Service: Endoscopy;  Laterality: N/A;  . ESOPHAGOGASTRODUODENOSCOPY (EGD) WITH PROPOFOL  N/A 08/26/2017   Procedure: ESOPHAGOGASTRODUODENOSCOPY (EGD) WITH PROPOFOL;  Surgeon: Virgel Manifold, MD;  Location: [facility];  Service: Endoscopy;  Laterality: N/A;  . FEMUR FRACTURE SURGERY Right 1989  . GASTRIC BYPASS     12/14  . HYSTEROSCOPY W/ ENDOMETRIAL ABLATION    . JOINT REPLACEMENT Bilateral    HIPS  . LAPAROSCOPIC LYSIS OF ADHESIONS  11/29/2015   Procedure: LAPAROSCOPIC LYSIS OF ADHESIONS;  Surgeon: Ladora Daniel, MD;  Location: [facility];  Service: General;;  . LAPAROSCOPY  11/29/2015   Procedure: LAPAROSCOPY DIAGNOSTIC;  Surgeon: Ladora Daniel, MD;  Location: [facility];  Service: General;;  . NASAL SINUS SURGERY    . TOTAL HIP ARTHROPLASTY Bilateral 2015    Allergies: Patient has no known allergies.  Medications: Prior to Admission medications   Medication Sig Start Date End Date Taking? Authorizing Provider  b complex vitamins tablet Take 1 tablet by mouth daily.   Yes [provider]  LORazepam (ATIVAN) 0.5 MG tablet Take 0.5 mg by mouth as needed.  02/02/15  Yes [provider]  metolazone (ZAROXOLYN) 2.5 MG tablet Take 2.5 mg by mouth every other day.   Yes [provider]  Multiple Vitamin (MULTIVITAMIN) capsule Take 1 capsule by mouth daily.   Yes [provider]  omeprazole (PRILOSEC) 20 MG capsule Take 20 mg by mouth every morning. EVE   Yes [provider]  oxycodone (OXY-IR) 5 MG capsule Take 5 mg by mouth every 4 (four) hours as needed.    Yes [provider]  potassium chloride (K-DUR) 10 MEQ tablet Take 10 mEq by mouth  every other day. TAKES ON THE DAYS SHE TAKES TORSEMIDE AND METOLAZONE   Yes [provider]  Rotigotine (NEUPRO) 8 MG/24HR PT24 Place 1 patch onto the skin daily.    Yes [provider]  sucralfate (CARAFATE) 1 GM/10ML suspension Take 1 g by mouth as needed.    Yes [provider]  torsemide (DEMADEX) 20 MG tablet Take 20 mg by mouth every other day.     Yes [provider]  desvenlafaxine (PRISTIQ) 100 MG 24 hr tablet Take 100 mg by mouth at bedtime.    [provider]  diazepam (VALIUM) 2 MG tablet Take 1 tablet (2 mg total) by mouth every 8 (eight) hours as needed for muscle spasms. Patient not taking: Reported on 12/03/2018 03/14/18   Paulette Blanch, MD  gabapentin (NEURONTIN) 300 MG capsule Take 300 mg by mouth 2 (two) times daily.    [provider]  oxyCODONE-acetaminophen (PERCOCET/ROXICET) 5-325 MG tablet Take 1 tablet by mouth every 4 (four) hours as needed for severe pain. Patient not taking: Reported on 12/03/2018 03/14/18   Paulette Blanch, MD  ranitidine (ZANTAC) 75 MG tablet Take 1 tablet (75 mg total) by mouth at bedtime. Patient not taking: Reported on 12/03/2018 09/09/17   Virgel Manifold, MD  traZODone (DESYREL) 100 MG tablet Take 50 mg by mouth at bedtime as needed for sleep.    [provider]     Family History  Problem Relation Age of Onset  . Pulmonary fibrosis Father   . Breast cancer Maternal Grandmother   . Heart disease Paternal Grandfather     Social History   Socioeconomic History  . Marital status: Married    Spouse name: Not on file  . Number of children: Not on file  . Years of education: Not on file  . Highest education level: Not on file  Occupational History  . Not on file  Social Needs  . Financial resource strain: Not on file  . Food insecurity    Worry: Not on file    Inability: Not on file  . Transportation needs    Medical: Not on file    Non-medical: Not on file  Tobacco Use  . Smoking status: Never Smoker  . Smokeless tobacco: Never Used  Substance and Sexual Activity  . Alcohol use: Yes    Comment: OCC  . Drug use: No  . Sexual activity: Not on file  Lifestyle  . Physical activity    Days per week: Not on file    Minutes per session: Not on file  . Stress: Not on file  Relationships  . Social Herbalist on phone: Not on file    Gets  together: Not on file    Attends religious service: Not on file    Active member of club or organization: Not on file    Attends meetings of clubs or organizations: Not on file    Relationship status: Not on file  Other Topics Concern  . Not on file  Social History Narrative  . Not on file       Review of Systems: A 12 point ROS discussed and pertinent positives are indicated in the HPI above.  All other systems are negative.  Review of Systems  Vital Signs: BP 113/62 (BP Location: Left Arm)   Pulse 81   Temp 98.1 F (36.7 C) (Oral)   Resp 14   Ht 5\' 7"  (1.702 m)   Wt 83.9  kg   LMP 03/12/2008 (Approximate)   SpO2 98%   BMI 28.98 kg/m   Physical Exam General: 53 yo female appearing stated age.  Well-developed, well-nourished.  No distress. HEENT: Atraumatic, normocephalic.  Conjugate gaze, extra-ocular motor intact. No scleral icterus or scleral injection. No lesions on external ears, nose, lips, or gums.  Oral mucosa moist, pink.  Neck: Symmetric with no goiter enlargement.  Chest/Lungs:  Symmetric chest with inspiration/expiration.  No labored breathing.  Clear to auscultation with no wheezes, rhonchi, or rales.  Heart:  RRR, with no third heart sounds appreciated. No JVD appreciated.  Abdomen:  Soft, NT/ND, with + bowel sounds.   Genito-urinary: Deferred Neurologic: Alert & Oriented to person, place, and time.   Normal affect and insight.  Appropriate questions.  Moving all 4 extremities with gross sensory intact.      Imaging: No results found.  Labs:  CBC: Recent Labs    03/14/18 0435 04/01/18 1143 12/03/18 1040  WBC 10.9* 8.6 7.7  HGB 12.0 13.4 12.3  HCT 36.9 41.8 37.7  PLT 416* 557* 378    COAGS: Recent Labs    12/03/18 1040  INR 1.0    BMP: Recent Labs    03/14/18 0435 04/01/18 1143  NA 132* 136  K 3.3* 3.0*  CL 97* 95*  CO2 23 31  GLUCOSE 131* 101*  BUN 24* 30*  CALCIUM 8.0* 9.1  CREATININE 1.27* 1.17*  GFRNONAA 49* 54*  GFRAA 57*  >60    LIVER FUNCTION TESTS: Recent Labs    03/14/18 0435  BILITOT 0.7  AST 30  ALT 18  ALKPHOS 98  PROT 7.0  ALBUMIN 3.6    TUMOR MARKERS: No results for input(s): AFPTM, CEA, CA199, CHROMGRNA in the last 8760 hours.  Assessment and Plan:  53 yo female with transaminitis, referred for image guided medical liver biospy.   Risks and benefits of liver biopsy was discussed with the patient and/or patient's family including, but not limited to bleeding, infection, damage to adjacent structures or low yield requiring additional tests.  All of the questions were answered and there is agreement to proceed.  Consent signed and in chart.    Thank you for this interesting consult.  I greatly enjoyed meeting LYNISE LAVANWAY and look forward to participating in their care.  A copy of this report was sent to the requesting provider on this date.  Electronically Signed: Corrie Mckusick, DO 12/03/2018, 12:06 PM   I spent a total of  15 Minutes   in face to face in clinical consultation, greater than 50% of which was counseling/coordinating care for image guided medical liver biopsy

## 2018-12-03 NOTE — Procedures (Signed)
Interventional Radiology Procedure Note  Procedure: US guided medical liver biopsy.  Complications: None Recommendations:  - Ok to shower tomorrow - Do not submerge for 7 days - Routine care  - follow up path  Signed,  Dulcy Fanny. Earleen Newport, DO

## 2018-12-04 LAB — SURGICAL PATHOLOGY

## 2019-01-06 ENCOUNTER — Other Ambulatory Visit: Payer: Self-pay

## 2019-01-06 DIAGNOSIS — Z20822 Contact with and (suspected) exposure to covid-19: Secondary | ICD-10-CM

## 2019-01-08 LAB — NOVEL CORONAVIRUS, NAA: SARS-CoV-2, NAA: NOT DETECTED

## 2019-02-02 ENCOUNTER — Other Ambulatory Visit: Payer: Self-pay

## 2019-02-02 DIAGNOSIS — Z20822 Contact with and (suspected) exposure to covid-19: Secondary | ICD-10-CM

## 2019-02-04 ENCOUNTER — Telehealth: Payer: Self-pay | Admitting: General Practice

## 2019-02-04 LAB — NOVEL CORONAVIRUS, NAA: SARS-CoV-2, NAA: NOT DETECTED

## 2019-02-04 NOTE — Telephone Encounter (Signed)
Negative COVID results given. Patient results "NOT Detected." Caller expressed understanding. ° °

## 2019-02-18 ENCOUNTER — Other Ambulatory Visit: Payer: Self-pay | Admitting: Internal Medicine

## 2019-02-18 DIAGNOSIS — R221 Localized swelling, mass and lump, neck: Secondary | ICD-10-CM

## 2019-02-18 DIAGNOSIS — R590 Localized enlarged lymph nodes: Secondary | ICD-10-CM

## 2019-02-25 ENCOUNTER — Ambulatory Visit
Admission: RE | Admit: 2019-02-25 | Discharge: 2019-02-25 | Disposition: A | Discharge status: Home / Self Care | Payer: BC Managed Care – PPO | Source: Ambulatory Visit | Attending: Internal Medicine | Admitting: Internal Medicine

## 2019-02-25 ENCOUNTER — Other Ambulatory Visit: Payer: Self-pay

## 2019-02-25 DIAGNOSIS — R590 Localized enlarged lymph nodes: Secondary | ICD-10-CM | POA: Insufficient documentation

## 2019-02-25 DIAGNOSIS — R221 Localized swelling, mass and lump, neck: Secondary | ICD-10-CM | POA: Insufficient documentation

## 2019-02-25 MED ORDER — IOHEXOL 300 MG/ML  SOLN
75.0000 mL | Freq: Once | INTRAMUSCULAR | Status: AC | PRN
  Administered 2019-02-25: 11:00:00 75 mL via INTRAVENOUS

## 2019-03-04 ENCOUNTER — Ambulatory Visit: Payer: BC Managed Care – PPO

## 2019-05-05 ENCOUNTER — Other Ambulatory Visit: Payer: Self-pay | Admitting: Internal Medicine

## 2019-05-05 DIAGNOSIS — R748 Abnormal levels of other serum enzymes: Secondary | ICD-10-CM

## 2019-05-18 ENCOUNTER — Other Ambulatory Visit: Payer: Self-pay

## 2019-05-18 ENCOUNTER — Encounter
Admission: RE | Admit: 2019-05-18 | Discharge: 2019-05-18 | Disposition: A | Discharge status: Home / Self Care | Payer: BC Managed Care – PPO | Source: Ambulatory Visit | Attending: Internal Medicine | Admitting: Internal Medicine

## 2019-05-18 ENCOUNTER — Ambulatory Visit
Admission: RE | Admit: 2019-05-18 | Discharge: 2019-05-18 | Disposition: A | Discharge status: Home / Self Care | Payer: BC Managed Care – PPO | Source: Ambulatory Visit | Attending: Internal Medicine | Admitting: Internal Medicine

## 2019-05-18 DIAGNOSIS — R748 Abnormal levels of other serum enzymes: Secondary | ICD-10-CM | POA: Insufficient documentation

## 2019-05-18 MED ORDER — TECHNETIUM TC 99M MEDRONATE IV KIT
21.4400 | PACK | Freq: Once | INTRAVENOUS | Status: AC | PRN
  Administered 2019-05-18: 21.44 via INTRAVENOUS

## 2019-06-04 ENCOUNTER — Other Ambulatory Visit (HOSPITAL_COMMUNITY): Payer: Self-pay | Admitting: Neurology

## 2019-06-04 DIAGNOSIS — R4189 Other symptoms and signs involving cognitive functions and awareness: Secondary | ICD-10-CM

## 2019-06-24 ENCOUNTER — Ambulatory Visit (HOSPITAL_COMMUNITY): Payer: BC Managed Care – PPO

## 2019-07-17 ENCOUNTER — Encounter (HOSPITAL_COMMUNITY): Payer: Self-pay

## 2019-07-17 ENCOUNTER — Ambulatory Visit (HOSPITAL_COMMUNITY): Admission: RE | Admit: 2019-07-17 | Payer: BC Managed Care – PPO | Source: Ambulatory Visit

## 2020-07-04 IMAGING — CT CT ABD-PELV W/ CM
2 of 5 series · 13 of 36 positions shown, 16 images · IV contrast (iopamidol)
Comparison: CT of the abdomen and pelvis from 11/18/2015, and CTA
of the chest performed 12/14/2011

CLINICAL DATA: Status post syncope and fall. Left-sided rib pain.
Concern for abdominal injury. Initial encounter.

EXAM:
CT CHEST, ABDOMEN, AND PELVIS WITH CONTRAST
TECHNIQUE: Multidetector CT imaging of the chest, abdomen and pelvis was
performed following the standard protocol during bolus
administration of intravenous contrast.
CONTRAST:  100mL B323BB-FLL IOPAMIDOL (B323BB-FLL) INJECTION 61%

[Series 2: cap with (person_name) · axial · 0.92mm/px · z∈[-817,-277]mm · 10 of 130 slices shown, 13 images]
[im 11/130  mediastinal]
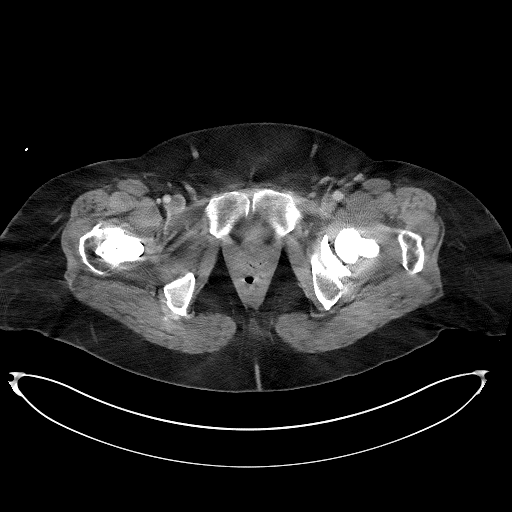
[im 11/130  lung]
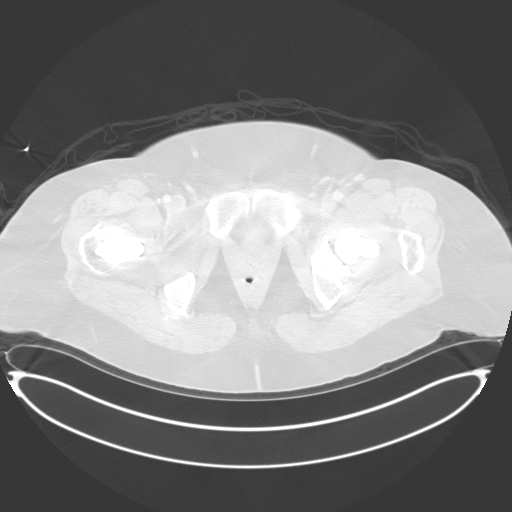
[im 22/130  lung]
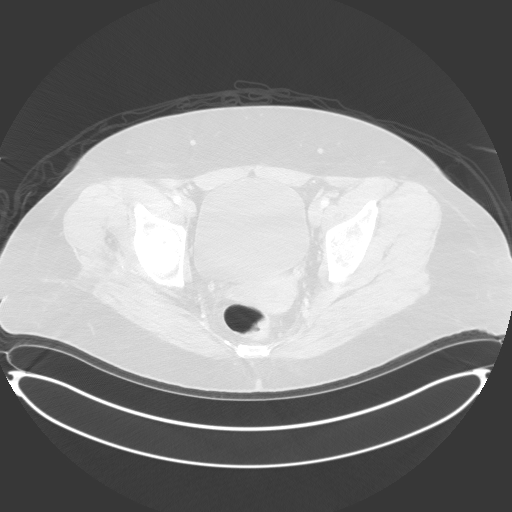
[im 33/130  lung]
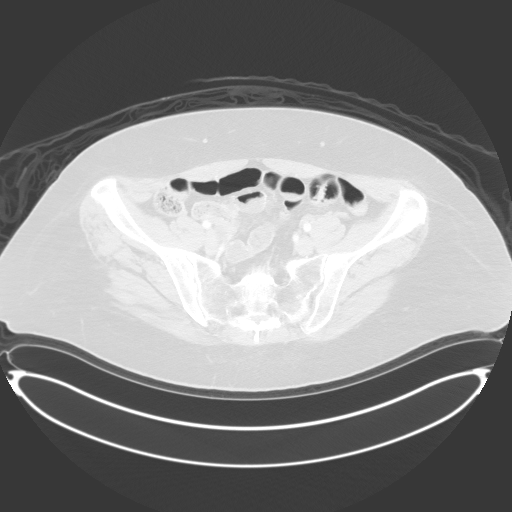
[im 44/130  lung]
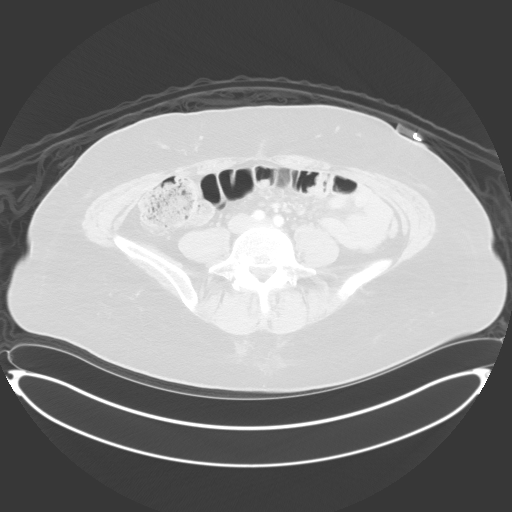
[im 54/130  mediastinal]
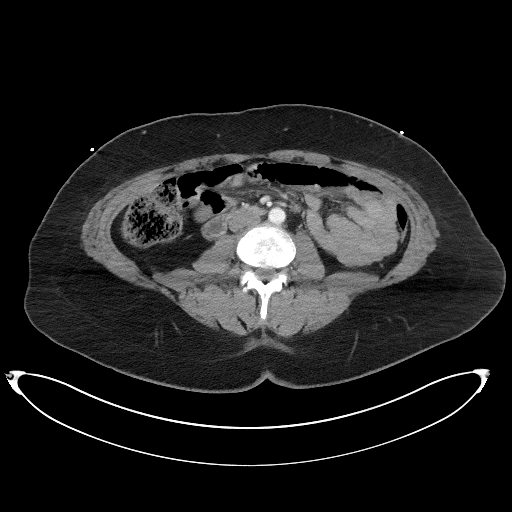
[im 54/130  lung]
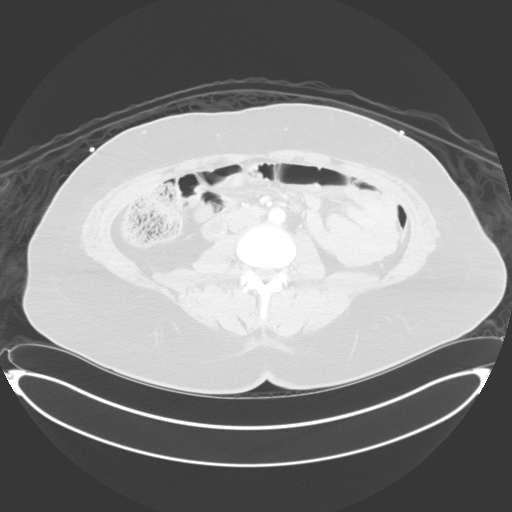
[im 76/130  lung]
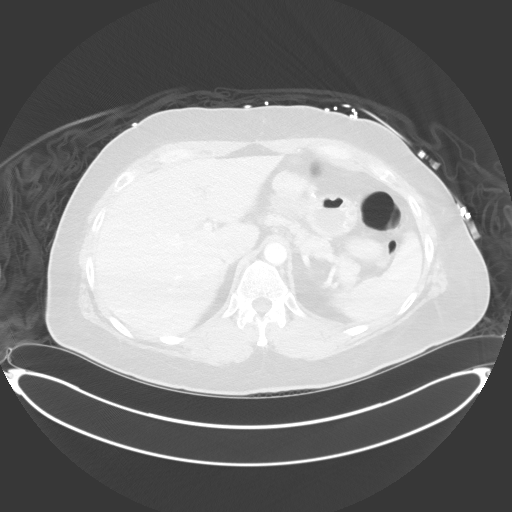
[im 87/130  lung]
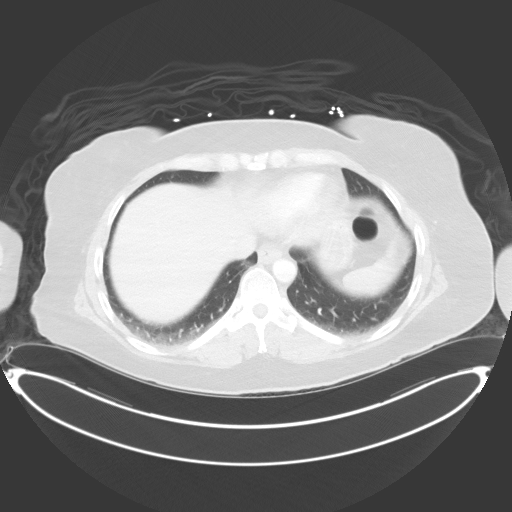
[im 97/130  lung]
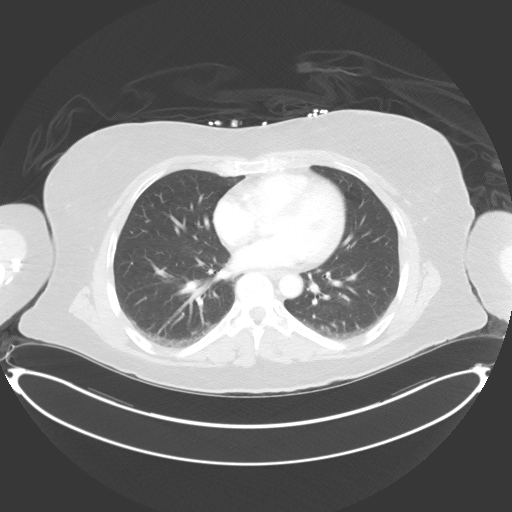
[im 108/130  mediastinal]
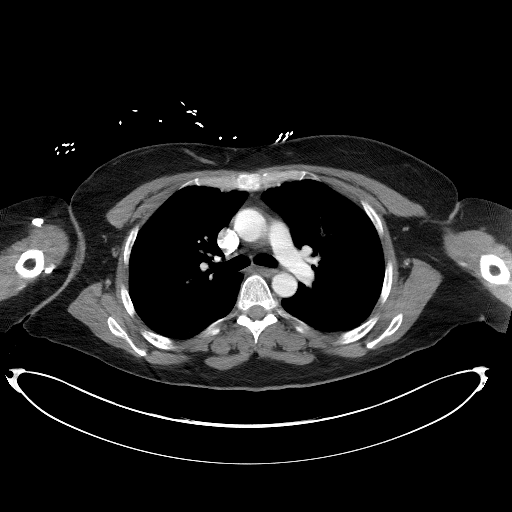
[im 108/130  lung]
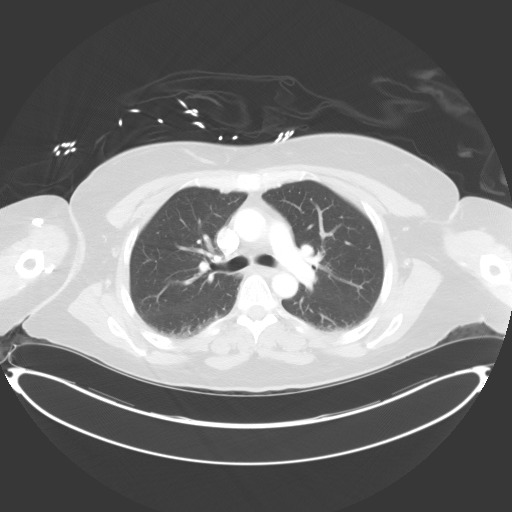
[im 119/130  lung]
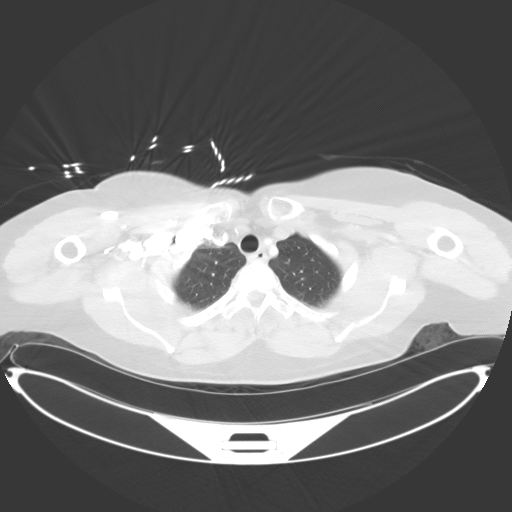

[Series 5: coronals · coronal · 0.89mm/px · 3 of 131 slices shown]
[im 27/131  lung]
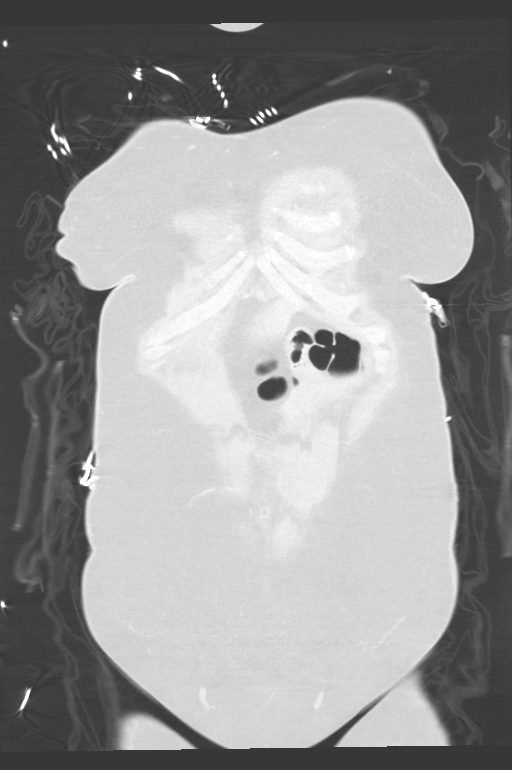
[im 53/131  lung]
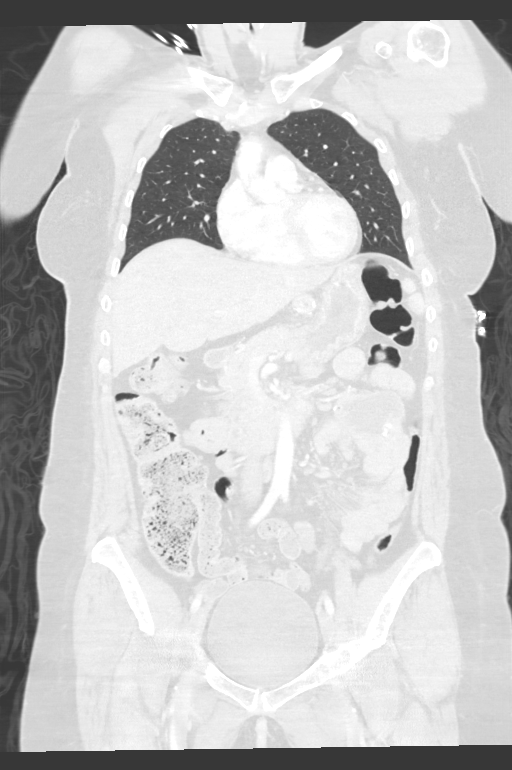
[im 79/131  lung]
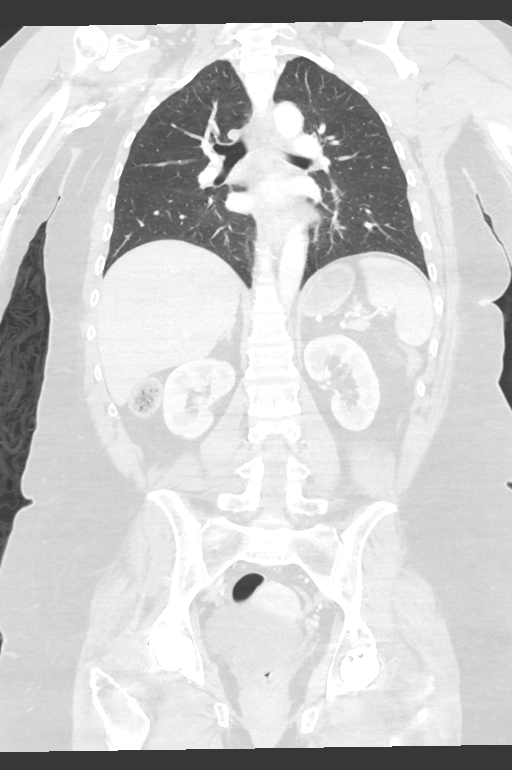

[13 of 36 positions shown; findings below may reference images not displayed]

FINDINGS: CT CHEST FINDINGS

Cardiovascular: The heart is normal in size. Mild calcification is
noted at the aortic arch. The great vessels are unremarkable in
appearance. There is no evidence of aortic injury. There is no
evidence of venous hemorrhage.

Mediastinum/Nodes: The mediastinum is unremarkable in appearance. No
mediastinal lymphadenopathy is seen. No pericardial effusion is
identified. The visualized portions of the thyroid gland are
unremarkable. No axillary lymphadenopathy is appreciated.

Lungs/Pleura: Minimal bilateral dependent subsegmental atelectasis
is noted. No focal consolidation, pleural effusion or pneumothorax
is seen. There is no evidence of pulmonary parenchymal contusion. No
masses are seen.

Musculoskeletal: No acute osseous abnormalities are identified. The
visualized musculature is unremarkable in appearance.

CT ABDOMEN PELVIS FINDINGS

Hepatobiliary: The liver is unremarkable in appearance. The patient
is status post cholecystectomy. The common bile duct remains normal
in caliber.

Pancreas: The pancreas is within normal limits.

Spleen: The spleen is unremarkable in appearance.

Adrenals/Urinary Tract: The adrenal glands are unremarkable in
appearance. The kidneys are within normal limits. There is no
evidence of hydronephrosis. No renal or ureteral stones are
identified. No perinephric stranding is seen.

Stomach/Bowel: The patient is status post gastric bypass surgery.
The gastrojejunal anastomosis is unremarkable. A small amount of
fluid is noted within the distal stomach. Bowel suture lines are
noted about the small bowel. The remaining small bowel is
unremarkable in appearance. The appendix is decompressed, without
evidence of appendicitis. The colon is unremarkable in appearance.

Vascular/Lymphatic: The abdominal aorta is unremarkable in
appearance. The inferior vena cava is grossly unremarkable. No
retroperitoneal lymphadenopathy is seen. No pelvic sidewall
lymphadenopathy is identified.

Reproductive: The bladder is moderately distended and grossly
unremarkable. The uterus is unremarkable in appearance. The ovaries
are grossly symmetric. No suspicious adnexal masses are seen.

Other: No additional soft tissue abnormalities are seen.

Musculoskeletal: No acute osseous abnormalities are identified. The
patient's bilateral hip arthroplasties are grossly unremarkable in
appearance, though incompletely imaged. The visualized musculature
is unremarkable in appearance.
IMPRESSION: 1. No evidence of traumatic injury to the chest, abdomen or pelvis.
2. Minimal bilateral dependent subsegmental atelectasis noted. Lungs
otherwise clear.

## 2020-10-18 ENCOUNTER — Other Ambulatory Visit: Payer: Self-pay | Admitting: Physician Assistant

## 2020-10-18 ENCOUNTER — Ambulatory Visit
Admission: RE | Admit: 2020-10-18 | Discharge: 2020-10-18 | Disposition: A | Discharge status: Home / Self Care | Payer: BC Managed Care – PPO | Source: Ambulatory Visit | Attending: Physician Assistant | Admitting: Physician Assistant

## 2020-10-18 ENCOUNTER — Other Ambulatory Visit: Payer: Self-pay

## 2020-10-18 DIAGNOSIS — R600 Localized edema: Secondary | ICD-10-CM | POA: Insufficient documentation

## 2021-05-17 ENCOUNTER — Other Ambulatory Visit: Payer: Self-pay | Admitting: Internal Medicine

## 2021-05-17 DIAGNOSIS — R52 Pain, unspecified: Secondary | ICD-10-CM

## 2021-05-17 DIAGNOSIS — G629 Polyneuropathy, unspecified: Secondary | ICD-10-CM

## 2021-06-05 ENCOUNTER — Other Ambulatory Visit: Payer: BC Managed Care – PPO

## 2021-06-05 ENCOUNTER — Inpatient Hospital Stay: Admission: RE | Admit: 2021-06-05 | Payer: BC Managed Care – PPO | Source: Ambulatory Visit

## 2021-08-18 ENCOUNTER — Ambulatory Visit: Payer: BC Managed Care – PPO | Admitting: Dietician

## 2021-09-11 ENCOUNTER — Encounter: Payer: BC Managed Care – PPO | Attending: Physician Assistant | Admitting: Dietician

## 2021-09-11 ENCOUNTER — Encounter: Payer: Self-pay | Admitting: Dietician

## 2021-09-11 VITALS — Ht 66.0 in | Wt 174.4 lb

## 2021-09-11 DIAGNOSIS — N1831 Chronic kidney disease, stage 3a: Secondary | ICD-10-CM

## 2021-09-11 DIAGNOSIS — Z9884 Bariatric surgery status: Secondary | ICD-10-CM

## 2021-09-11 NOTE — Progress Notes (Signed)
Medical Nutrition Therapy: Visit start time: 1100  end time: 1200  Assessment:   Referral Diagnosis: CKD stage 3a, history of RnY Gastric bypass surgery 02/2013 Other medical history/ diagnoses: gastric bypass revision surgery, due to peptic ulcer; restless leg syndrome Psychosocial issues/ stress concerns: anxiety, depression, high stress level  Medications, supplements: reconciled list in medical record   Preferred learning method:  Auditory Visual Hands-on    Current weight: 174.4lbs Height: 5'6" BMI: 28.15 Patient's personal weight goal: 155-160lbs  Progress and evaluation:  Patient reports frequent grazing, whether hungry or not, sometimes during the night. She does not eat large food portions, just often. She has had increased stress caring for mother with Alzheimer's dementia, recently moved to assisted living Reports weight loss down to <140lbs after bariatric surgery, but has regained some weight in past several years, in part due to stress/ depression.  Had several surgeries, including hip replacement x2, bypass gastroenterostomy 2017, revision of gastrojejunostomy, small bowel obstruction  She feels she is in good health overall despite health history Recent labwork indicates eGFR 46, sodium 138, potassium 3.3, chloride 96, creatinine 1.22, calcium 8.6 (06/27/21). Potassium has measured low in the past and patient is taking potassium supplement daily. Food allergies: none known Special diet practices: none Patient seeks help with coordinating dietary needs for CKD along with bariatric diet, feels she needs to decrease late night snacking    Dietary Intake:  Usual eating pattern includes 3 meals and 3-4 snacks per day. Dining out frequency: 12-15 meals per week. Who plans meals/ buys groceries? Self, spouse Who prepares meals? self  Breakfast: cereal; peanut butter and jelly toast; 1/2 biscuit; mocha frappe Snack: chips; cheese and crackers; fruit; cucumbers Lunch:  varies-- 1/4 of 6-inch sub; salad bar; lasagna; pizza; [place] Snack: same as am Supper: same as lunch Snack: same as am, or cereal; cookies Beverages: water, tea in am;  lemonade; caff free soda; juice; 2% milk  Physical activity: walking limited due to foot pain    Intervention:   Nutrition Care Education:   Basic nutrition: basic food groups; appropriate nutrient balance; appropriate meal and snack schedule; general nutrition guidelines    Weight control: importance of low sugar and low fat choices; portion control; instructed on basic meal planning for protein and carb control; discussed physical activity and long term weight control and metabolic health Kidney disease: controlling protein intake; calculated protein needs for 0.8g-1g/kg body weight (based on goal weight) with goal of 60-70g protein daily; instructed on low sodium intake; discussed potential future restriction of phosphorus, potassium depending on lab results but encouraged limiting processed foods with phosphorus containing additives  Other intervention notes: Patient voices readiness for change to slow progression of kidney disease.  She plans to monitor food intake to ensure she meets goals for protein and carbohydrates.  She will control sodium by limiting processed foods and checking food labels.  She did not wish to schedule MNT follow up at this time, but will schedule later if needed.   Nutritional Diagnosis:  [place]-2.1 Inpaired nutrition utilization and [place]-2.2 Altered nutrition-related laboratory As related to chronic kidney disease, history of gastric bypass surgery and revision.  As evidenced by low GFR, surgical history. [place]-3.3 Overweight/obesity As related to stress, frequent snacking.  As evidenced by patient reported dietary history, with current BMI of 28.15.   Education Materials given:  Chronic Kidney Disease Nutrition Therapy (NCM) Bariatric menus for 1000-1200kcal (AND) Plate Planner with food  lists, sample meal pattern Visit summary with goals/ instructions  Learner/ who was taught:  Patient   Level of understanding: Verbalizes/ demonstrates competency  Demonstrated degree of understanding via:   Teach back Learning barriers: None Short term memory lapses per patient; she took notes during visit; provided patient with written materials  Willingness to learn/ readiness for change: Eager, change in progress  Monitoring and Evaluation:  Dietary intake, exercise, renal function status, and body weight      follow up: prn

## 2021-09-11 NOTE — Patient Instructions (Signed)
Use app to monitor food intake  Control protein to 60-70grams daily; 5-6oz of meats/ eggs/ dairy Include some healthy carbs  with meals, up to 30grams each meal (2 servings) Limit sodium in foods to ideally '200mg'$  or less per serving.  Continue to eat plenty of low carb veggies and some fruits.

## 2021-11-21 ENCOUNTER — Other Ambulatory Visit: Payer: Self-pay | Admitting: Neurology

## 2021-11-21 ENCOUNTER — Other Ambulatory Visit (HOSPITAL_COMMUNITY): Payer: Self-pay | Admitting: Neurology

## 2021-11-21 DIAGNOSIS — R4189 Other symptoms and signs involving cognitive functions and awareness: Secondary | ICD-10-CM

## 2021-11-28 ENCOUNTER — Encounter (HOSPITAL_COMMUNITY): Payer: Self-pay

## 2021-11-28 ENCOUNTER — Ambulatory Visit (HOSPITAL_COMMUNITY): Payer: BC Managed Care – PPO

## 2021-12-20 ENCOUNTER — Ambulatory Visit (HOSPITAL_COMMUNITY): Payer: BC Managed Care – PPO

## 2022-01-01 ENCOUNTER — Ambulatory Visit (HOSPITAL_COMMUNITY)
Admission: RE | Admit: 2022-01-01 | Discharge: 2022-01-01 | Disposition: A | Discharge status: Home / Self Care | Payer: BC Managed Care – PPO | Source: Ambulatory Visit | Attending: Neurology | Admitting: Neurology

## 2022-01-01 DIAGNOSIS — R4189 Other symptoms and signs involving cognitive functions and awareness: Secondary | ICD-10-CM | POA: Diagnosis present

## 2022-12-09 ENCOUNTER — Encounter: Payer: Self-pay | Admitting: Emergency Medicine

## 2022-12-09 ENCOUNTER — Ambulatory Visit
Admission: EM | Admit: 2022-12-09 | Discharge: 2022-12-09 | Disposition: A | Discharge status: Home / Self Care | Payer: BC Managed Care – PPO | Attending: Emergency Medicine | Admitting: Emergency Medicine

## 2022-12-09 DIAGNOSIS — J069 Acute upper respiratory infection, unspecified: Secondary | ICD-10-CM | POA: Insufficient documentation

## 2022-12-09 DIAGNOSIS — Z1152 Encounter for screening for COVID-19: Secondary | ICD-10-CM | POA: Insufficient documentation

## 2022-12-09 LAB — SARS CORONAVIRUS 2 BY RT PCR: SARS Coronavirus 2 by RT PCR: NEGATIVE

## 2022-12-09 MED ORDER — PROMETHAZINE-DM 6.25-15 MG/5ML PO SYRP: 5.0000 mL | ORAL_SOLUTION | Freq: Four times a day (QID) | ORAL | 0 refills | Status: AC | PRN

## 2022-12-09 NOTE — Discharge Instructions (Addendum)
Your covid test is negative. Most likely you have a viral illness: no antibiotic as indicated at this time, May treat with OTC meds of choice. Make sure to drink plenty of fluids to stay hydrated(gatorade, water, popsicles,jello,etc), avoid caffeine products. Follow up with PCP. Return as needed.  Take cough med as directed, drowsiness precautions.

## 2022-12-09 NOTE — ED Triage Notes (Signed)
Patient c/o cough, chest congestion, chest tightness, and headache that started on Thursday.  Patient unsure of fevers.

## 2022-12-09 NOTE — ED Provider Notes (Signed)
[facility]    CSN: [id number] Arrival date & time: 12/09/22  1106      History   Chief Complaint Chief Complaint  Patient presents with   Cough   Shortness of Breath   Headache    HPI Brittney Diaz is a 57 y.o. female.   57 year old female, Brittney Diaz, presents to urgent care for evaluation of cough,congestion, chest tightness, and headache x 3 days. No known fevers, unknown illness exposure.   The history is provided by the patient. No language interpreter was used.    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    Patient Active Problem List   Diagnosis Date Noted   Viral URI with cough 12/09/2022   Special screening for malignant neoplasms, colon    Benign neoplasm of transverse colon    Hypertrophy, anal papillae    Difficulty in swallowing    Intestinal bypass or anastomosis status    Avascular necrosis of bone of hip (HCC) 03/01/2015   B12 deficiency 03/01/2015   DUB (dysfunctional uterine bleeding) 03/01/2015   Headache, migraine 03/01/2015   Frequent UTI 03/01/2015   Arthralgia of temporomandibular joint 03/01/2015   Anemia, iron deficiency 08/23/2014   Abnormal gait 04/20/2014   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/20/2014   History of repair of hip joint 03/30/2014   Leg swelling 03/30/2014   H/O total hip arthroplasty 03/23/2014   Arthralgia of hip 01/12/2014   Acid reflux 11/10/2013   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 11/10/2013   Restless leg 11/10/2013   Avascular necrosis of bone (HCC) 07/27/2013    Past Surgical History:   Procedure Laterality Date   CHOLECYSTECTOMY N/A 11/29/2015   Procedure: LAPAROSCOPIC CHOLECYSTECTOMY;  Surgeon: Everette Rank, MD;  Location: [facility];  Service: General;  Laterality: N/A;   COLONOSCOPY WITH PROPOFOL N/A 08/26/2017   Procedure: COLONOSCOPY WITH PROPOFOL;  Surgeon: Pasty Spillers, MD;  Location: [facility];  Service: Endoscopy;  Laterality: N/A;   DILITATION & CURRETTAGE/HYSTROSCOPY WITH NOVASURE ABLATION N/A 07/30/2013   Procedure: DILATATION & CURETTAGE/HYSTEROSCOPY WITH  THERMACHOICE, AND POLYPECTOMY;  Surgeon: Lenoard Aden, MD;  Location: [facility];  Service: Gynecology;  Laterality: N/A;   ESOPHAGOGASTRODUODENOSCOPY N/A 11/29/2015   Procedure: ESOPHAGOGASTRODUODENOSCOPY (EGD);  Surgeon: Everette Rank, MD;  Location: [facility];  Service: General;  Laterality: N/A;   ESOPHAGOGASTRODUODENOSCOPY (EGD) WITH PROPOFOL N/A 03/03/2015   Procedure: ESOPHAGOGASTRODUODENOSCOPY (EGD) WITH PROPOFOL with balloon dilation;  Surgeon: Midge Minium, MD;  Location: [facility];  Service: Endoscopy;  Laterality: N/A;   ESOPHAGOGASTRODUODENOSCOPY (EGD) WITH PROPOFOL N/A 08/26/2017   Procedure: ESOPHAGOGASTRODUODENOSCOPY (EGD) WITH PROPOFOL;  Surgeon: Pasty Spillers, MD;  Location: [facility];  Service: Endoscopy;  Laterality: N/A;   FEMUR FRACTURE SURGERY Right 1989   GASTRIC BYPASS     12/14   HYSTEROSCOPY W/ ENDOMETRIAL ABLATION     JOINT REPLACEMENT Bilateral    HIPS   LAPAROSCOPIC LYSIS OF ADHESIONS  11/29/2015  Procedure: LAPAROSCOPIC LYSIS OF ADHESIONS;  Surgeon: Everette Rank, MD;  Location: [facility];  Service: General;;   LAPAROSCOPY  11/29/2015   Procedure: LAPAROSCOPY DIAGNOSTIC;  Surgeon: Everette Rank, MD;  Location: [facility];  Service: General;;   NASAL SINUS SURGERY     TOTAL HIP ARTHROPLASTY Bilateral 2015    OB History   No obstetric history on file.      Home Medications    Prior to Admission medications   Medication Sig Start Date  End Date Taking? Authorizing Provider  gabapentin (NEURONTIN) 100 MG capsule 1-3 po qhs 06/26/21  Yes [provider]  lamoTRIgine (LAMICTAL) 150 MG tablet Take 1 tablet by mouth every morning. 12/19/20  Yes [provider]  Multiple Vitamins-Iron (MULTIVITAMINS WITH IRON) TABS tablet Take 1 tablet by mouth every morning.   Yes [provider]  omeprazole (PRILOSEC) 20 MG capsule Take 20 mg by mouth every morning. EVE   Yes [provider]  promethazine-dextromethorphan (PROMETHAZINE-DM) 6.25-15 MG/5ML syrup Take 5 mLs by mouth 4 (four) times daily as needed for cough. 12/09/22  Yes Gissele Narducci, Para March, NP  QUEtiapine (SEROQUEL XR) 50 MG TB24 24 hr tablet Take 1 tablet by mouth every evening. 04/26/21  Yes [provider]  Rotigotine (NEUPRO) 8 MG/24HR PT24 Place 1 patch onto the skin daily.    Yes [provider]  B Complex Vitamins (B COMPLEX 1 PO) Take by mouth.    [provider]  cyclobenzaprine (FLEXERIL) 10 MG tablet Take 10 mg by mouth 3 (three) times daily as needed. 08/10/21   [provider]  desvenlafaxine (PRISTIQ) 100 MG 24 hr tablet Take 100 mg by mouth at bedtime.    [provider]  hydrOXYzine (VISTARIL) 25 MG capsule Take by mouth. 03/30/21   [provider]  LORazepam (ATIVAN) 0.5 MG tablet Take by mouth. 02/02/15   [provider]  metolazone (ZAROXOLYN) 2.5 MG tablet Take 2.5 mg by mouth every other day.    [provider]  ondansetron (ZOFRAN-ODT) 4 MG disintegrating tablet Take by mouth. 01/30/19   [provider]  oxycodone (OXY-IR) 5 MG capsule Take 5 mg by mouth every 4 (four) hours as needed.     [provider]  oxyCODONE-acetaminophen (PERCOCET/ROXICET) 5-325 MG tablet Take 1 tablet by mouth every 4 (four) hours as needed for severe pain. 03/14/18   Irean Hong, MD  potassium chloride (KLOR-CON) 10 MEQ tablet     [provider]  ranitidine  (ZANTAC) 75 MG tablet Take 1 tablet (75 mg total) by mouth at bedtime. 09/09/17   Pasty Spillers, MD  sucralfate (CARAFATE) 1 GM/10ML suspension Take 1 g by mouth as needed.     [provider]  torsemide (DEMADEX) 20 MG tablet Take 20 mg by mouth every other day.    [provider]  traZODone (DESYREL) 100 MG tablet Take 50 mg by mouth at bedtime as needed for sleep.    [provider]    Family History Family History  Problem Relation Age of Onset   Pulmonary fibrosis Father    Breast cancer Maternal Grandmother    Heart disease Paternal Grandfather     Social History Social History   Tobacco Use   Smoking status: Never   Smokeless tobacco: Never  Vaping Use   Vaping status: Never Used  Substance Use Topics   Alcohol use: Yes    Alcohol/week: 1.0 - 3.0 standard drink of alcohol  Types: 1 - 3 Standard drinks or equivalent per week    Comment: occasional   Drug use: No     Allergies   Patient has no known allergies.   Review of Systems Review of Systems  HENT:  Positive for congestion.   Respiratory:  Positive for cough and chest tightness.   Neurological:  Positive for headaches.  All other systems reviewed and are negative.    Physical Exam Triage Vital Signs ED Triage Vitals  Encounter Vitals Group     BP      Systolic BP Percentile      Diastolic BP Percentile      Pulse      Resp      Temp      Temp src      SpO2      Weight      Height      Head Circumference      Peak Flow      Pain Score      Pain Loc      Pain Education      Exclude from Growth Chart    No data found.  Updated Vital Signs BP 114/72 (BP Location: Left Arm)   Pulse 78   Temp 98.1 F (36.7 C) (Oral)   Resp 15   Ht 5\' 6"  (1.676 m)   Wt 174 lb 6.1 oz (79.1 kg)   SpO2 95%   BMI 28.15 kg/m   Visual Acuity Right Eye Distance:   Left Eye Distance:   Bilateral Distance:    Right Eye Near:   Left Eye Near:    Bilateral Near:      Physical Exam Vitals and nursing note reviewed.  Constitutional:      Appearance: She is well-developed and well-groomed.  HENT:     Head: Normocephalic.  Cardiovascular:     Rate and Rhythm: Normal rate.     Pulses: Normal pulses.     Heart sounds: Normal heart sounds.  Pulmonary:     Effort: Pulmonary effort is normal.     Breath sounds: Normal breath sounds.  Neurological:     General: No focal deficit present.     Mental Status: She is alert and oriented to person, place, and time.     GCS: GCS eye subscore is 4. GCS verbal subscore is 5. GCS motor subscore is 6.  Psychiatric:        Attention and Perception: Attention normal.        Mood and Affect: Mood normal.        Speech: Speech normal.        Behavior: Behavior normal. Behavior is cooperative.      UC Treatments / Results  Labs (all labs ordered are listed, but only abnormal results are displayed) Labs Reviewed  SARS CORONAVIRUS 2 BY RT PCR    EKG   Radiology No results found.  Procedures Procedures (including critical care time)  Medications Ordered in UC Medications - No data to display  Initial Impression / Assessment and Plan / UC Course  I have reviewed the triage vital signs and the nursing notes.  Pertinent labs & imaging results that were available during my care of the patient were reviewed by me and considered in my medical decision making (see chart for details).  Clinical Course as of 12/09/22 1535  Sun Dec 09, 2022  1211 Covid is negative [JD]    Clinical Course User Index [JD] Clancy Gourd, NP  Discussed exam findings and plan of care with patient, strict go to ER precautions given.   Patient verbalized understanding to this provider.  Ddx: Viral illness, allergies Final Clinical Impressions(s) / UC Diagnoses   Final diagnoses:  Viral URI with cough     Discharge Instructions      Your covid test is negative. Most likely you have a viral illness: no antibiotic as  indicated at this time, May treat with OTC meds of choice. Make sure to drink plenty of fluids to stay hydrated(gatorade, water, popsicles,jello,etc), avoid caffeine products. Follow up with PCP. Return as needed.  Take cough med as directed, drowsiness precautions.      ED Prescriptions     Medication Sig Dispense Auth. Provider   promethazine-dextromethorphan (PROMETHAZINE-DM) 6.25-15 MG/5ML syrup Take 5 mLs by mouth 4 (four) times daily as needed for cough. 118 mL Karlton Maya, Para March, NP      PDMP not reviewed this encounter.   Clancy Gourd, NP 12/09/22 1535

## 2023-05-02 ENCOUNTER — Other Ambulatory Visit: Payer: Self-pay | Admitting: Neurology

## 2023-05-02 DIAGNOSIS — R4789 Other speech disturbances: Secondary | ICD-10-CM

## 2023-05-02 DIAGNOSIS — R2689 Other abnormalities of gait and mobility: Secondary | ICD-10-CM

## 2023-05-02 DIAGNOSIS — G3184 Mild cognitive impairment, so stated: Secondary | ICD-10-CM

## 2023-05-12 ENCOUNTER — Ambulatory Visit: Payer: 59
